# Patient Record
Sex: Female | Born: 1990 | Race: Black or African American | Hispanic: No | Marital: Single | Smoking: Former smoker
Health system: Southern US, Community
[De-identification: ages and names within clinical notes are randomized; demographics above are authoritative.]

## PROBLEM LIST (undated history)

## (undated) ENCOUNTER — Inpatient Hospital Stay (HOSPITAL_COMMUNITY): Payer: Self-pay

## (undated) DIAGNOSIS — N898 Other specified noninflammatory disorders of vagina: Secondary | ICD-10-CM

## (undated) DIAGNOSIS — A630 Anogenital (venereal) warts: Secondary | ICD-10-CM

## (undated) DIAGNOSIS — N83209 Unspecified ovarian cyst, unspecified side: Secondary | ICD-10-CM

## (undated) DIAGNOSIS — L0291 Cutaneous abscess, unspecified: Secondary | ICD-10-CM

## (undated) HISTORY — PX: THERAPEUTIC ABORTION: SHX798

---
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
## 2010-10-18 ENCOUNTER — Other Ambulatory Visit: Payer: Self-pay | Admitting: Internal Medicine

## 2010-10-18 DIAGNOSIS — R102 Pelvic and perineal pain: Secondary | ICD-10-CM

## 2010-10-22 ENCOUNTER — Ambulatory Visit
Admission: RE | Admit: 2010-10-22 | Discharge: 2010-10-22 | Disposition: A | Discharge status: Home / Self Care | Payer: No Typology Code available for payment source | Source: Ambulatory Visit | Attending: Internal Medicine | Admitting: Internal Medicine

## 2010-10-22 ENCOUNTER — Other Ambulatory Visit: Payer: Self-pay

## 2010-10-22 DIAGNOSIS — R102 Pelvic and perineal pain: Secondary | ICD-10-CM

## 2010-11-03 LAB — WET PREP, GENITAL

## 2010-11-03 LAB — GC/CHLAMYDIA PROBE AMP, GENITAL: Chlamydia, DNA Probe: NEGATIVE

## 2010-11-15 LAB — URINALYSIS, ROUTINE W REFLEX MICROSCOPIC
Glucose, UA: NEGATIVE
Hgb urine dipstick: NEGATIVE
Protein, ur: NEGATIVE
Specific Gravity, Urine: 1.02
Urobilinogen, UA: >8 — ABNORMAL HIGH

## 2010-11-15 LAB — URINE MICROSCOPIC-ADD ON

## 2010-11-15 LAB — POCT PREGNANCY, URINE: Preg Test, Ur: NEGATIVE

## 2010-11-15 LAB — WET PREP, GENITAL: Trich, Wet Prep: NONE SEEN

## 2010-11-15 LAB — GC/CHLAMYDIA PROBE AMP, GENITAL: GC Probe Amp, Genital: NEGATIVE

## 2011-02-01 DIAGNOSIS — N83209 Unspecified ovarian cyst, unspecified side: Secondary | ICD-10-CM

## 2011-02-01 HISTORY — DX: Unspecified ovarian cyst, unspecified side: N83.209

## 2011-02-17 ENCOUNTER — Encounter (HOSPITAL_COMMUNITY): Payer: Self-pay | Admitting: Emergency Medicine

## 2011-02-17 ENCOUNTER — Emergency Department (HOSPITAL_COMMUNITY)
Admission: EM | Admit: 2011-02-17 | Discharge: 2011-02-17 | Disposition: A | Discharge status: Home / Self Care | Payer: Self-pay | Attending: Emergency Medicine | Admitting: Emergency Medicine

## 2011-02-17 DIAGNOSIS — R21 Rash and other nonspecific skin eruption: Secondary | ICD-10-CM | POA: Insufficient documentation

## 2011-02-17 DIAGNOSIS — B86 Scabies: Secondary | ICD-10-CM | POA: Insufficient documentation

## 2011-02-17 DIAGNOSIS — L298 Other pruritus: Secondary | ICD-10-CM | POA: Insufficient documentation

## 2011-02-17 DIAGNOSIS — L2989 Other pruritus: Secondary | ICD-10-CM | POA: Insufficient documentation

## 2011-02-17 HISTORY — DX: Unspecified ovarian cyst, unspecified side: N83.209

## 2011-02-17 MED ORDER — PERMETHRIN 5 % EX CREA: TOPICAL_CREAM | CUTANEOUS | Status: AC

## 2011-02-17 NOTE — ED Notes (Signed)
Per Pt: pt noticed bumps on skin and thought it was related to eczema (Oct 2012), over time noticed that bumps are between fingers, toes, axilla, back and buttocks. Appear to pt to be scabies, boyfriend also appears to have it. (not being treated yet)

## 2011-02-17 NOTE — ED Provider Notes (Signed)
History     CSN: [id number]  Arrival date & time 02/17/11  1523   First MD Initiated Contact with Patient 02/17/11 1549      Chief Complaint  Patient presents with  . Rash    (Consider location/radiation/quality/duration/timing/severity/associated sxs/prior treatment) Patient is a 21 y.o. female presenting with rash. The history is provided by the patient.  Rash  This is a new problem. The current episode started more than 1 week ago. The problem has been gradually worsening. The problem is associated with an unknown factor. There has been no fever. Affected Location: hands, feet, breasts, axilla, buttocks. The patient is experiencing no pain. Associated symptoms include itching. She has tried antihistamines and anti-itch cream for the symptoms. The treatment provided mild relief.  Pt and boyfriend with same rash. No new exposures to soaps, lotions, detergents, or pets. Lives in a dorm.  Past Medical History  Diagnosis Date  . Ovarian cyst     History reviewed. No pertinent past surgical history.  No family history on file.  History  Substance Use Topics  . Smoking status: Never Smoker   . Smokeless tobacco: Not on file  . Alcohol Use: Yes     occassional, light     Review of Systems  Constitutional: Negative for fever and chills.  HENT: Negative for neck pain and neck stiffness.   Eyes: Negative for visual disturbance.  Respiratory: Negative for shortness of breath.   Cardiovascular: Negative for chest pain.  Gastrointestinal: Negative for vomiting and abdominal pain.  Musculoskeletal: Negative for back pain and gait problem.  Skin: Positive for itching and rash.  Neurological: Negative for dizziness, syncope and headaches.  Psychiatric/Behavioral: Negative for confusion.    Allergies  Review of patient's allergies indicates no known allergies.  Home Medications  No current outpatient prescriptions on file.  BP 108/70  Pulse 84  Temp(Src) 98.4 F (36.9 C)  (Oral)  Resp 18  Ht 5\' 7"  (1.702 m)  Wt 160 lb (72.576 kg)  BMI 25.06 kg/m2  SpO2 98%  LMP 01/26/2011  Physical Exam  Nursing note and vitals reviewed. Constitutional: She is oriented to person, place, and time. She appears well-developed and well-nourished. No distress.  HENT:  Head: Normocephalic and atraumatic.  Right Ear: External ear normal.  Left Ear: External ear normal.  Eyes: Pupils are equal, round, and reactive to light.  Neck: Normal range of motion. Neck supple.  Cardiovascular: Normal rate and regular rhythm.   Pulmonary/Chest: Effort normal. No respiratory distress.  Abdominal: Soft. She exhibits no distension. There is no tenderness.  Musculoskeletal: She exhibits no edema and no tenderness.  Neurological: She is alert and oriented to person, place, and time. No cranial nerve deficit. Coordination normal.       Gait normal  Skin: Skin is warm and dry. Rash noted.       Scattered papular rash to all extremities, breasts, axilla with tunnelling to various areas of hands, left breast, left foot. No pustules or erythema.    ED Course  Procedures (including critical care time)  Labs Reviewed - No data to display No results found.     MDM  Rash c/w scabies- will treat with permethrin and have advised tx of partner as well        Shaaron Adler, [place] 02/17/11 1604

## 2011-02-17 NOTE — ED Provider Notes (Signed)
Medical screening examination/treatment/procedure(s) were performed by non-physician practitioner and as supervising physician I was immediately available for consultation/collaboration.  Gevin Perea R. Menna Abeln, MD 02/17/11 1623 

## 2011-05-30 ENCOUNTER — Encounter (HOSPITAL_COMMUNITY): Payer: Self-pay | Admitting: *Deleted

## 2011-05-30 ENCOUNTER — Emergency Department (HOSPITAL_COMMUNITY)
Admission: EM | Admit: 2011-05-30 | Discharge: 2011-05-30 | Disposition: A | Discharge status: Home / Self Care | Payer: Self-pay | Attending: Emergency Medicine | Admitting: Emergency Medicine

## 2011-05-30 DIAGNOSIS — N751 Abscess of Bartholin's gland: Secondary | ICD-10-CM | POA: Insufficient documentation

## 2011-05-30 MED ORDER — PROMETHAZINE HCL 25 MG/ML IJ SOLN
12.5000 mg | Freq: Once | INTRAMUSCULAR | Status: AC
  Administered 2011-05-30: 12.5 mg via INTRAMUSCULAR
  Filled 2011-05-30: qty 1

## 2011-05-30 MED ORDER — HYDROMORPHONE HCL PF 1 MG/ML IJ SOLN
1.0000 mg | Freq: Once | INTRAMUSCULAR | Status: AC
  Administered 2011-05-30: 1 mg via INTRAMUSCULAR
  Filled 2011-05-30: qty 1

## 2011-05-30 MED ORDER — OXYCODONE-ACETAMINOPHEN 5-325 MG PO TABS: 2.0000 | ORAL_TABLET | ORAL | Status: AC | PRN

## 2011-05-30 NOTE — ED Notes (Signed)
Rx given x1 D/c instructions reviewed w/ pt - pt denies any further questions or concerns at present.   

## 2011-05-30 NOTE — ED Provider Notes (Addendum)
History     CSN: [id number]  Arrival date & time 05/30/11  1520   First MD Initiated Contact with Patient 05/30/11 1717      Chief Complaint  Patient presents with  . Abscess    (Consider location/radiation/quality/duration/timing/severity/associated sxs/prior treatment) Patient is a 21 y.o. female presenting with abscess. The history is provided by the patient.  Abscess  This is a new problem. The current episode started less than one week ago. The onset was gradual. The problem occurs continuously. The problem has been gradually worsening. Affected Location: left labia. The problem is severe. The abscess is characterized by painfulness and burning. The abscess first occurred at home. Pertinent negatives include no fever, no diarrhea, no vomiting, no congestion, no rhinorrhea and no cough.    Past Medical History  Diagnosis Date  . Ovarian cyst     History reviewed. No pertinent past surgical history.  History reviewed. No pertinent family history.  History  Substance Use Topics  . Smoking status: Never Smoker   . Smokeless tobacco: Not on file  . Alcohol Use: Yes     occassional, light    OB History    Grav Para Term Preterm Abortions TAB SAB Ect Mult Living                  Review of Systems  Constitutional: Negative for fever, chills, diaphoresis and fatigue.  HENT: Negative for congestion, rhinorrhea and sneezing.   Eyes: Negative.   Respiratory: Negative for cough, chest tightness and shortness of breath.   Cardiovascular: Negative for chest pain and leg swelling.  Gastrointestinal: Negative for nausea, vomiting, abdominal pain, diarrhea and blood in stool.  Genitourinary: Negative for frequency, hematuria, flank pain and difficulty urinating.  Musculoskeletal: Negative for back pain and arthralgias.  Skin: Negative for rash.  Neurological: Negative for dizziness, speech difficulty, weakness, numbness and headaches.    Allergies  Review of patient's  allergies indicates no known allergies.  Home Medications   Current Outpatient Rx  Name Route Sig Dispense Refill  . ASPIRIN 325 MG PO TABS Oral Take 325 mg by mouth daily.    Marland Kitchen PRESCRIPTION MEDICATION Oral Take 1 tablet by mouth once. 1 time dose antibiotic tablet post-abortion.    . OXYCODONE-ACETAMINOPHEN 5-325 MG PO TABS Oral Take 2 tablets by mouth every 4 (four) hours as needed for pain. 15 tablet 0    BP 107/73  Pulse 83  Temp(Src) 98.9 F (37.2 C) (Oral)  Resp 16  SpO2 100%  Physical Exam  Constitutional: She is oriented to person, place, and time. She appears well-developed and well-nourished.  HENT:  Head: Normocephalic and atraumatic.  Eyes: Pupils are equal, round, and reactive to light.  Neck: Normal range of motion. Neck supple.  Cardiovascular: Normal rate, regular rhythm and normal heart sounds.   Pulmonary/Chest: Effort normal and breath sounds normal. No respiratory distress. She has no wheezes. She has no rales. She exhibits no tenderness.  Abdominal: Soft. Bowel sounds are normal. There is no tenderness. There is no rebound and no guarding.  Genitourinary:       2cm fluctuant abscess left labia  Musculoskeletal: Normal range of motion. She exhibits no edema.  Lymphadenopathy:    She has no cervical adenopathy.  Neurological: She is alert and oriented to person, place, and time.  Skin: Skin is warm and dry. No rash noted.  Psychiatric: She has a normal mood and affect.    ED Course  INCISION AND DRAINAGE Date/Time: 05/30/2011  8:57 PM Performed by: Rolan Bucco Authorized by: Rolan Bucco Consent: Verbal consent obtained. Risks and benefits: risks, benefits and alternatives were discussed Consent given by: patient Patient identity confirmed: verbally with patient Type: abscess Body area: anogenital Location details: vulva Anesthesia: local infiltration Local anesthetic: lidocaine 2% without epinephrine Anesthetic total: 2 ml Patient sedated:  no Scalpel size: 11 Incision type: single straight Complexity: simple Drainage: bloody and purulent Drainage amount: copious Wound treatment: wound left open Packing material: 1/4 in iodoform gauze Patient tolerance: Patient tolerated the procedure well with no immediate complications.   (including critical care time)   Labs Reviewed  WOUND CULTURE   No results found.   1. Bartholin's gland abscess       MDM  Pt with likely bartholins gland abscess.  No surrounding cellulitis.  No hx of vag d/c.  Advised warm soaks.  Return in 2 days for wound check  Or sooner if symtpoms worsen        Rolan Bucco, MD 05/30/11 4098  Rolan Bucco, MD 05/30/11 2059

## 2011-05-30 NOTE — ED Notes (Signed)
Pt states she has L labia ulcer for the past 2 days.  Pt states that she has some body aches as well.  No hx of abscess.  Pt states that she had abscess last week, but it became worse after she had surgical abortion.  Pt states that lip is swollen, and hard abscess is about 1/2".

## 2011-05-30 NOTE — ED Notes (Signed)
Pt in c/o cyst to left labia x3-4 days, increased pain and swelling today

## 2011-05-30 NOTE — Discharge Instructions (Signed)
Bartholin's Cyst and Abscess  Bartholin's glands produce mucus through small openings just outside the opening of the vagina. The mucus helps with lubrication around the vagina during sexual intercourse. If the duct becomes clogged, the gland will swell and cause a bulge on the inside of the vagina. If this becomes big enough, it can be seen and felt on the outside of the vagina as well. Sometimes, the swelling will shrink away by itself. However, if the cyst becomes infected, the Bartholin's cyst fills with pus and becomes more swollen, red and painful and becomes a Bartholin's abscess. This usually requires antibiotic treatment and surgical drainage. Sometimes, with minor surgery under local anesthesia, a small tube is placed in the cyst or abscess wall. This allows continued drainage for up to 6 weeks. Minor surgery can make a new opening to replace the clogged duct and help prevent future cysts or abscess.  If the abscess occurs several times, a minor operation with local anesthesia is necessary to remove the Bartholin's gland completely or to make it drain better. Cutting open the gland and suturing the edges to make the opening of the gland bigger (marsupialization) may be needed and should usually be done by your obstetrician-gyncology physician. Antibiotics are usually prescribed for this condition. Take all antibiotics as prescribed. Make sure to finish them even if you are doing better. Take warm sitz baths for 20 minutes, 3 times a day. See your caregiver for follow-up care as recommended.  SEEK MEDICAL CARE IF:    You have increasing pain, swelling, or redness near the vagina.   You have vomiting or inability to tolerate medicines.   You have a fever.   You have uncontrolled bleeding from the vagina.  Document Released: 02/25/2004 Document Revised: 01/06/2011 Document Reviewed: 02/27/2009  [employer] Patient Information 2012 [employer].

## 2011-06-01 ENCOUNTER — Encounter (HOSPITAL_COMMUNITY): Payer: Self-pay | Admitting: Emergency Medicine

## 2011-06-01 ENCOUNTER — Emergency Department (HOSPITAL_COMMUNITY)
Admission: EM | Admit: 2011-06-01 | Discharge: 2011-06-02 | Disposition: A | Discharge status: Home / Self Care | Payer: Self-pay | Attending: Emergency Medicine | Admitting: Emergency Medicine

## 2011-06-01 DIAGNOSIS — N750 Cyst of Bartholin's gland: Secondary | ICD-10-CM | POA: Insufficient documentation

## 2011-06-01 DIAGNOSIS — Z7982 Long term (current) use of aspirin: Secondary | ICD-10-CM | POA: Insufficient documentation

## 2011-06-01 NOTE — ED Notes (Signed)
Pt alert, nad, c/o wound check, pt seen in ED and returns for drain removal, drain located in vagina, resp even unlabored, skin pwd

## 2011-06-02 LAB — WOUND CULTURE: Culture: NO GROWTH

## 2011-06-02 NOTE — Discharge Instructions (Signed)
It looks like your abscess is healing well. Continue to keep it clean. Warm compresses or warm soapy baths several times a day. Continue your pain medications. Take motrin 600mg  every 6 hrs for pain.  Follow up with GYN as needed.   Bartholin's Cyst or Abscess Bartholin's glands are small glands located within the folds of skin (labia) along the sides of the lower opening of the vagina (birth canal). A cyst may develop when the duct of the gland becomes blocked. When this happens, fluid that accumulates within the cyst can become infected. This is known as an abscess. The Bartholin gland produces a mucous fluid to lubricate the outside of the vagina during sexual intercourse. SYMPTOMS   Patients with a small cyst may not have any symptoms.   Mild discomfort to severe pain depending on the size of the cyst and if it is infected (abscess).   Pain, redness, and swelling around the lower opening of the vagina.   Painful intercourse.   Pressure in the perineal area.   Swelling of the lips of the vagina (labia).   The cyst or abscess can be on one side or both sides of the vagina.  DIAGNOSIS   A large swelling is seen in the lower vagina area by your caregiver.   Painful to touch.   Redness and pain, if it is an abscess.  TREATMENT   Sometimes the cyst will go away on its own.   Apply warm wet compresses to the area or take hot sitz baths several times a day.   An incision to drain the cyst or abscess with local anesthesia.   Culture the pus, if it is an abscess.   Antibiotic treatment, if it is an abscess.   Cut open the gland and suture the edges to make the opening of the gland bigger (marsupialization).   Remove the whole gland if the cyst or abscess returns.  PREVENTION   Practice good hygiene.   Clean the vaginal area with a mild soap and soft cloth when bathing.   Do not rub hard in the vaginal area when bathing.   Protect the crotch area with a padded cushion if you  take long bike rides or ride horses.   Be sure you are well lubricated when you have sexual intercourse.  HOME CARE INSTRUCTIONS   If your cyst or abscess was opened, a small piece of gauze, or a drain, may have been placed in the wound to allow drainage. Do not remove this gauze or drain unless directed by your caregiver.   Wear feminine pads, not tampons, as needed for any drainage or bleeding.   If antibiotics were prescribed, take them exactly as directed. Finish the entire course.   Only take over-the-counter or prescription medicines for pain, discomfort, or fever as directed by your caregiver.  SEEK IMMEDIATE MEDICAL CARE IF:   You have an increase in pain, redness, swelling, or drainage.   You have bleeding from the wound which results in the use of more than the number of pads suggested by your caregiver in 24 hours.   You have chills.   You have a fever.   You develop any new problems (symptoms) or aggravation of your existing condition.  MAKE SURE YOU:   Understand these instructions.   Will watch your condition.   Will get help right away if you are not doing well or get worse.  Document Released: 01/17/2005 Document Revised: 01/06/2011 Document Reviewed: 09/05/2007 Exit[employer] Patient Information  [address] [place].

## 2011-06-02 NOTE — ED Provider Notes (Signed)
History     CSN: [id number]  Arrival date & time 06/01/11  2325   First MD Initiated Contact with Patient 06/02/11 0149      Chief Complaint  Patient presents with  . Wound Check    (Consider location/radiation/quality/duration/timing/severity/associated sxs/prior treatment) Patient is a 21 y.o. female presenting with wound check. The history is provided by the patient.  Wound Check  She was treated in the ED 2 to 3 days ago. Previous treatment in the ED includes I&D of abscess. There has been no treatment since the wound repair.  Pt states she had a bartholin's gland abscess drained and packed. Here for recheck and removal. States still having pain, but it decreased in size. Denies fever, chills, malaise. No other complaints.   Past Medical History  Diagnosis Date  . Ovarian cyst     History reviewed. No pertinent past surgical history.  No family history on file.  History  Substance Use Topics  . Smoking status: Never Smoker   . Smokeless tobacco: Not on file  . Alcohol Use: Yes     occassional, light    OB History    Grav Para Term Preterm Abortions TAB SAB Ect Mult Living                  Review of Systems  Constitutional: Negative for fever and chills.  Respiratory: Negative.   Cardiovascular: Negative.   Genitourinary: Negative.   Musculoskeletal: Negative.   Skin: Positive for wound.  Neurological: Negative.     Allergies  Review of patient's allergies indicates no known allergies.  Home Medications   Current Outpatient Rx  Name Route Sig Dispense Refill  . ASPIRIN 325 MG PO TABS Oral Take 325 mg by mouth daily.    . OXYCODONE-ACETAMINOPHEN 5-325 MG PO TABS Oral Take 2 tablets by mouth every 4 (four) hours as needed for pain. 15 tablet 0    BP 112/72  Pulse 98  Temp 98 F (36.7 C)  Resp 16  SpO2 98%  Physical Exam  Nursing note and vitals reviewed. Constitutional: She is oriented to person, place, and time. She appears well-developed and  well-nourished. No distress.  HENT:  Head: Normocephalic.  Pulmonary/Chest: Effort normal and breath sounds normal. No respiratory distress.  Abdominal: Soft. Bowel sounds are normal. She exhibits no distension. There is no tenderness.  Genitourinary:       Pt on her period. Left labia appears normal in size, tender, small incision from recent I&D with packing in place. No drainage  Neurological: She is alert and oriented to person, place, and time.  Skin: Skin is warm and dry.  Psychiatric: She has a normal mood and affect.    ED Course  Procedures (including critical care time)  Bartholin's gland I&D 2 days ago. NO swelling. No complications. Packing removed. No drainage. Will d/c home with follow up as needed. She is non toxic, nad.   1. Bartholin gland cyst       MDM          Lottie Mussel, PA 06/02/11 0501

## 2011-06-02 NOTE — ED Provider Notes (Signed)
Medical screening examination/treatment/procedure(s) were performed by non-physician practitioner and as supervising physician I was immediately available for consultation/collaboration.  Olivia Mackie, MD 06/02/11 [phone]

## 2012-03-02 ENCOUNTER — Emergency Department (HOSPITAL_COMMUNITY): Payer: Self-pay

## 2012-03-02 ENCOUNTER — Encounter (HOSPITAL_COMMUNITY): Payer: Self-pay | Admitting: Emergency Medicine

## 2012-03-02 ENCOUNTER — Emergency Department (HOSPITAL_COMMUNITY)
Admission: EM | Admit: 2012-03-02 | Discharge: 2012-03-03 | Disposition: A | Discharge status: Home / Self Care | Payer: Self-pay | Attending: Emergency Medicine | Admitting: Emergency Medicine

## 2012-03-02 DIAGNOSIS — Z87891 Personal history of nicotine dependence: Secondary | ICD-10-CM | POA: Insufficient documentation

## 2012-03-02 DIAGNOSIS — M549 Dorsalgia, unspecified: Secondary | ICD-10-CM | POA: Insufficient documentation

## 2012-03-02 DIAGNOSIS — N949 Unspecified condition associated with female genital organs and menstrual cycle: Secondary | ICD-10-CM | POA: Insufficient documentation

## 2012-03-02 DIAGNOSIS — Z8742 Personal history of other diseases of the female genital tract: Secondary | ICD-10-CM | POA: Insufficient documentation

## 2012-03-02 DIAGNOSIS — R1031 Right lower quadrant pain: Secondary | ICD-10-CM | POA: Insufficient documentation

## 2012-03-02 DIAGNOSIS — R112 Nausea with vomiting, unspecified: Secondary | ICD-10-CM | POA: Insufficient documentation

## 2012-03-02 DIAGNOSIS — Z3490 Encounter for supervision of normal pregnancy, unspecified, unspecified trimester: Secondary | ICD-10-CM

## 2012-03-02 DIAGNOSIS — K921 Melena: Secondary | ICD-10-CM | POA: Insufficient documentation

## 2012-03-02 DIAGNOSIS — Z872 Personal history of diseases of the skin and subcutaneous tissue: Secondary | ICD-10-CM | POA: Insufficient documentation

## 2012-03-02 DIAGNOSIS — Z3201 Encounter for pregnancy test, result positive: Secondary | ICD-10-CM | POA: Insufficient documentation

## 2012-03-02 HISTORY — DX: Cutaneous abscess, unspecified: L02.91

## 2012-03-02 LAB — COMPREHENSIVE METABOLIC PANEL
Albumin: 3.7 g/dL (ref 3.5–5.2)
Alkaline Phosphatase: 47 U/L (ref 39–117)
BUN: 10 mg/dL (ref 6–23)
CO2: 23 mEq/L (ref 19–32)
Chloride: 100 mEq/L (ref 96–112)
Glucose, Bld: 101 mg/dL — ABNORMAL HIGH (ref 70–99)
Potassium: 4 mEq/L (ref 3.5–5.1)
Sodium: 134 mEq/L — ABNORMAL LOW (ref 135–145)
Total Protein: 8.1 g/dL (ref 6.0–8.3)

## 2012-03-02 LAB — CBC WITH DIFFERENTIAL/PLATELET
Basophils Absolute: 0 10*3/uL (ref 0.0–0.1)
Basophils Relative: 0 % (ref 0–1)
HCT: 32.4 % — ABNORMAL LOW (ref 36.0–46.0)
Lymphocytes Relative: 13 % (ref 12–46)
MCV: 77.1 fL — ABNORMAL LOW (ref 78.0–100.0)
Neutro Abs: 8.9 10*3/uL — ABNORMAL HIGH (ref 1.7–7.7)
Neutrophils Relative %: 80 % — ABNORMAL HIGH (ref 43–77)
Platelets: 323 10*3/uL (ref 150–400)
RBC: 4.2 MIL/uL (ref 3.87–5.11)
RDW: 15.9 % — ABNORMAL HIGH (ref 11.5–15.5)

## 2012-03-02 LAB — OCCULT BLOOD, POC DEVICE: Fecal Occult Bld: NEGATIVE

## 2012-03-02 MED ORDER — ONDANSETRON HCL 4 MG/2ML IJ SOLN
4.0000 mg | Freq: Once | INTRAMUSCULAR | Status: AC
  Administered 2012-03-02: 4 mg via INTRAVENOUS
  Filled 2012-03-02: qty 2

## 2012-03-02 MED ORDER — SODIUM CHLORIDE 0.9 % IV SOLN
Freq: Once | INTRAVENOUS | Status: AC
  Administered 2012-03-02: via INTRAVENOUS

## 2012-03-02 NOTE — ED Provider Notes (Signed)
History     CSN: [id number]  Arrival date & time 03/02/12  2148   First MD Initiated Contact with Patient 03/02/12 2251      Chief Complaint  Patient presents with  . Abdominal Pain  . Melena    (Consider location/radiation/quality/duration/timing/severity/associated sxs/prior treatment) HPI Comments: Patient states she has had 3-4 months of diffuse abdominal pain that moves locations daily.  Also reports black stools, nausea and vomiting today. She usually takes Motrin for the discomfort but this is not helping for the past several days, report a creamy "normal' vaginal discharge, denies dysuria   Patient is a 22 y.o. female presenting with abdominal pain. The history is provided by the patient.  Abdominal Pain The primary symptoms of the illness include abdominal pain, nausea and vomiting. The primary symptoms of the illness do not include fever, shortness of breath, diarrhea, dysuria or vaginal bleeding. The current episode started more than 2 days ago. The onset of the illness was gradual. The problem has been gradually worsening.  The abdominal pain began more than 2 days ago. The pain came on gradually. The abdominal pain has been gradually worsening since its onset. The abdominal pain is generalized. The severity of the abdominal pain is 4/10. The abdominal pain is relieved by nothing.  Nausea began yesterday.  The vomiting began today. The emesis contains bilious material.  The patient states that she believes she is currently not pregnant. The patient has not had a change in bowel habit. Additional symptoms associated with the illness include back pain. Symptoms associated with the illness do not include chills, constipation, hematuria or frequency.    Past Medical History  Diagnosis Date  . Ovarian cyst   . Abscess     Past Surgical History  Procedure Date  . Therapeutic abortion     No family history on file.  History  Substance Use Topics  . Smoking status:  Former Smoker    Types: Pipe    Quit date: 01/31/2010  . Smokeless tobacco: Never Used  . Alcohol Use: Yes     Comment: occassional, light    OB History    Grav Para Term Preterm Abortions TAB SAB Ect Mult Living                  Review of Systems  Constitutional: Negative for fever and chills.  HENT: Negative.   Eyes: Negative.   Respiratory: Negative for shortness of breath.   Gastrointestinal: Positive for nausea, vomiting and abdominal pain. Negative for diarrhea and constipation.  Genitourinary: Positive for vaginal pain and pelvic pain. Negative for dysuria, frequency, hematuria, vaginal bleeding and menstrual problem.  Musculoskeletal: Positive for back pain.  Skin: Negative for rash and wound.  Neurological: Negative for dizziness and weakness.    Allergies  Review of patient's allergies indicates no known allergies.  Home Medications   Current Outpatient Rx  Name  Route  Sig  Dispense  Refill  . PRENATAL COMPLETE 14-0.4 MG PO TABS   Oral   Take 1 tablet by mouth 1 day or 1 dose.   60 each   0     BP 97/58  Pulse 81  Temp 98 F (36.7 C) (Oral)  Resp 20  SpO2 100%  LMP 01/26/2012  Physical Exam  Constitutional: She is oriented to person, place, and time. She appears well-developed and well-nourished.  HENT:  Head: Normocephalic.  Eyes: Pupils are equal, round, and reactive to light.  Neck: Normal range of motion.  Cardiovascular: Normal rate and regular rhythm.   Pulmonary/Chest: Effort normal and breath sounds normal. She has no wheezes. She exhibits no tenderness.  Abdominal: She exhibits no distension. There is no hepatomegaly. There is tenderness in the right lower quadrant and epigastric area.  Genitourinary: Rectal exam shows no external hemorrhoid, no internal hemorrhoid and no tenderness. Guaiac negative stool. Cervix exhibits discharge. Cervix exhibits no motion tenderness. Right adnexum displays tenderness. Vaginal discharge found.   Musculoskeletal: Normal range of motion.  Neurological: She is alert and oriented to person, place, and time.  Skin: Skin is warm and dry.    ED Course  Procedures (including critical care time)  Labs Reviewed  URINALYSIS, MICROSCOPIC ONLY - Abnormal; Notable for the following:    APPearance TURBID (*)     All other components within normal limits  CBC WITH DIFFERENTIAL - Abnormal; Notable for the following:    WBC 11.2 (*)     Hemoglobin 10.7 (*)     HCT 32.4 (*)     MCV 77.1 (*)     MCH 25.5 (*)     RDW 15.9 (*)     Neutrophils Relative 80 (*)     Neutro Abs 8.9 (*)     All other components within normal limits  COMPREHENSIVE METABOLIC PANEL - Abnormal; Notable for the following:    Sodium 134 (*)     Glucose, Bld 101 (*)     Total Bilirubin 0.1 (*)     All other components within normal limits  WET PREP, GENITAL - Abnormal; Notable for the following:    WBC, Wet Prep HPF POC RARE (*)     All other components within normal limits  HCG, QUANTITATIVE, PREGNANCY - Abnormal; Notable for the following:    hCG, Beta Chain, Quant, S 6351 (*)     All other components within normal limits  RH IG WORKUP (INCLUDES ABO/RH)  OCCULT BLOOD, POC DEVICE  GC/CHLAMYDIA PROBE AMP   Mr Pelvis Wo Contrast  03/03/2012  *RADIOLOGY REPORT*  Clinical Data: Right lower quadrant pain in a patient with 5-week intrauterine pregnancy.  MRI ABDOMEN WITHOUT CONTRAST,MRI PELVIS WITHOUT CONTRAST  Technique:  Multiplanar multisequence MR imaging of the abdomen was performed.  Multi-planar multi-sequence MR imaging of the pelvis was performed following the standard protocol. No intravenous contrast was administered.  Comparison: None.  Findings: The appendix is partially segmentally identified and appears normal.  There is no evidence of abnormal fluid or infiltration in the right lower quadrant.  No abscess.  Stool filled colon without distension.  No small bowel distension. Visualized portions of the liver and  spleen, pancreas, gallbladder, kidneys, and abdominal aorta are unremarkable.  No free fluid in the abdomen.  Pelvis:  The uterus is anteverted.  An intrauterine fluid collection is identified consistent with the known intrauterine gestational sac.  There appears to be small cysts on the ovaries. The bladder wall is not thickened.  No free or loculated pelvic fluid collections.  Visualized bone marrow signal intensities are homogeneous and normal.  IMPRESSION: Segmental identification of a normal appendix without any inflammatory changes seen in the right lower quadrant.  Nothing to suggest appendicitis.  Intrauterine gestational sac is identified.   Original Report Authenticated By: Burman Nieves, M.D.    Mr Abdomen Wo Contrast  03/03/2012  *RADIOLOGY REPORT*  Clinical Data: Right lower quadrant pain in a patient with 5-week intrauterine pregnancy.  MRI ABDOMEN WITHOUT CONTRAST,MRI PELVIS WITHOUT CONTRAST  Technique:  Multiplanar multisequence MR imaging  of the abdomen was performed.  Multi-planar multi-sequence MR imaging of the pelvis was performed following the standard protocol. No intravenous contrast was administered.  Comparison: None.  Findings: The appendix is partially segmentally identified and appears normal.  There is no evidence of abnormal fluid or infiltration in the right lower quadrant.  No abscess.  Stool filled colon without distension.  No small bowel distension. Visualized portions of the liver and spleen, pancreas, gallbladder, kidneys, and abdominal aorta are unremarkable.  No free fluid in the abdomen.  Pelvis:  The uterus is anteverted.  An intrauterine fluid collection is identified consistent with the known intrauterine gestational sac.  There appears to be small cysts on the ovaries. The bladder wall is not thickened.  No free or loculated pelvic fluid collections.  Visualized bone marrow signal intensities are homogeneous and normal.  IMPRESSION: Segmental identification of a  normal appendix without any inflammatory changes seen in the right lower quadrant.  Nothing to suggest appendicitis.  Intrauterine gestational sac is identified.   Original Report Authenticated By: Burman Nieves, M.D.    US Ob Comp Less 14 Wks  03/03/2012  *RADIOLOGY REPORT*  Clinical Data: Right lower quadrant pain.  5-week-2-day gestational age by LMP.  OBSTETRIC <14 WK [place] AND TRANSVAGINAL OB US  Technique:  Both transabdominal and transvaginal ultrasound examinations were performed for complete evaluation of the gestation as well as the maternal uterus, adnexal regions, and pelvic cul-de-sac.  Transvaginal technique was performed to assess early pregnancy.  Comparison:  None.  Intrauterine gestational sac:  Visualized/normal in shape. Yolk sac: Not visualized Embryo: Not visualized  MSD: 8 mm  5 w 4 d  Maternal uterus/adnexae: No fibroids identified.  Small left ovarian corpus luteum noted, as well as a simple left para ovarian cyst measuring 1.9 cm.  Right ovary is normal in appearance.  No adnexal masses or free fluid identified.  IMPRESSION:  1.  Single intrauterine gestational sac measuring 5 weeks 4 days, which is concordant with LMP. 2.  No significant maternal uterine or adnexal abnormality identified.   Original Report Authenticated By: Myles Rosenthal, M.D.    US Ob Transvaginal  03/03/2012  *RADIOLOGY REPORT*  Clinical Data: Right lower quadrant pain.  5-week-2-day gestational age by LMP.  OBSTETRIC <14 WK [place] AND TRANSVAGINAL OB US  Technique:  Both transabdominal and transvaginal ultrasound examinations were performed for complete evaluation of the gestation as well as the maternal uterus, adnexal regions, and pelvic cul-de-sac.  Transvaginal technique was performed to assess early pregnancy.  Comparison:  None.  Intrauterine gestational sac:  Visualized/normal in shape. Yolk sac: Not visualized Embryo: Not visualized  MSD: 8 mm  5 w 4 d  Maternal uterus/adnexae: No fibroids identified.  Small left  ovarian corpus luteum noted, as well as a simple left para ovarian cyst measuring 1.9 cm.  Right ovary is normal in appearance.  No adnexal masses or free fluid identified.  IMPRESSION:  1.  Single intrauterine gestational sac measuring 5 weeks 4 days, which is concordant with LMP. 2.  No significant maternal uterine or adnexal abnormality identified.   Original Report Authenticated By: Myles Rosenthal, M.D.      1. Pregnancy   2. Abdominal pain, right lower quadrant       MDM  Due to RLQ pain and normal intrauterine pregnancy will get MRI to R/O appendicitis MRI normal       Arman Filter, NP 03/03/12 [phone]

## 2012-03-02 NOTE — ED Notes (Signed)
Pt c/o of 9/10 lower abdominal/pelvic pain that began after an ovarian cyst that occurred several months ago. Pt states that the pain moves around in her stomach. Pt reports nausea with soft soft stools that are black. Bowel sounds positive in four quadrant, pt reports tenderness upon palpation. Pt denies vaginal bleeding, however states she has had a creamy white discharge that does not have an odor.

## 2012-03-03 ENCOUNTER — Ambulatory Visit (HOSPITAL_COMMUNITY)
Admit: 2012-03-03 | Discharge: 2012-03-03 | Disposition: A | Discharge status: Home / Self Care | Payer: Self-pay | Attending: Internal Medicine | Admitting: Internal Medicine

## 2012-03-03 ENCOUNTER — Emergency Department (HOSPITAL_COMMUNITY): Payer: Self-pay

## 2012-03-03 LAB — URINALYSIS, MICROSCOPIC ONLY
Bilirubin Urine: NEGATIVE
Hgb urine dipstick: NEGATIVE
Leukocytes, UA: NEGATIVE
Nitrite: NEGATIVE
Protein, ur: NEGATIVE mg/dL
Specific Gravity, Urine: 1.025 (ref 1.005–1.030)
Urobilinogen, UA: 1 mg/dL (ref 0.0–1.0)

## 2012-03-03 LAB — WET PREP, GENITAL: Clue Cells Wet Prep HPF POC: NONE SEEN

## 2012-03-03 LAB — RH IG WORKUP (INCLUDES ABO/RH)
ABO/RH(D): A POS
Gestational Age(Wks): 5

## 2012-03-03 MED ORDER — PRENATAL COMPLETE 14-0.4 MG PO TABS: 1.0000 | ORAL_TABLET | ORAL | Status: DC

## 2012-03-03 MED ORDER — HYDROMORPHONE HCL PF 1 MG/ML IJ SOLN
1.0000 mg | Freq: Once | INTRAMUSCULAR | Status: AC
  Administered 2012-03-03: 1 mg via INTRAVENOUS
  Filled 2012-03-03: qty 1

## 2012-03-03 NOTE — ED Notes (Signed)
Pt has transferred to [facility] for MRI

## 2012-03-03 NOTE — ED Notes (Signed)
[facility] RN notified of pt transport to MRI

## 2012-03-03 NOTE — ED Notes (Signed)
Per Lab pt is RH+. No Rhogam needed.

## 2012-03-03 NOTE — ED Notes (Signed)
Pt continues to be at [facility] hospital for MRI

## 2012-03-03 NOTE — ED Notes (Signed)
Pt has returned just now via [facility] from [facility],  Test are completed

## 2012-03-04 LAB — GC/CHLAMYDIA PROBE AMP: CT Probe RNA: NEGATIVE

## 2012-03-04 NOTE — ED Provider Notes (Signed)
Medical screening examination/treatment/procedure(s) were performed by non-physician practitioner and as supervising physician I was immediately available for consultation/collaboration.   Mayah Urquidi M Tunis Gentle, DO 03/04/12 1933 

## 2012-07-30 ENCOUNTER — Emergency Department (HOSPITAL_COMMUNITY)
Admission: EM | Admit: 2012-07-30 | Discharge: 2012-07-30 | Disposition: A | Discharge status: Home / Self Care | Payer: Self-pay | Attending: Emergency Medicine | Admitting: Emergency Medicine

## 2012-07-30 ENCOUNTER — Encounter (HOSPITAL_COMMUNITY): Payer: Self-pay | Admitting: *Deleted

## 2012-07-30 DIAGNOSIS — N760 Acute vaginitis: Secondary | ICD-10-CM | POA: Insufficient documentation

## 2012-07-30 DIAGNOSIS — L0291 Cutaneous abscess, unspecified: Secondary | ICD-10-CM

## 2012-07-30 DIAGNOSIS — Z8742 Personal history of other diseases of the female genital tract: Secondary | ICD-10-CM | POA: Insufficient documentation

## 2012-07-30 DIAGNOSIS — Z87891 Personal history of nicotine dependence: Secondary | ICD-10-CM | POA: Insufficient documentation

## 2012-07-30 MED ORDER — OXYCODONE-ACETAMINOPHEN 5-325 MG PO TABS
2.0000 | ORAL_TABLET | Freq: Once | ORAL | Status: AC
  Administered 2012-07-30: 2 via ORAL
  Filled 2012-07-30: qty 2

## 2012-07-30 MED ORDER — SULFAMETHOXAZOLE-TRIMETHOPRIM 800-160 MG PO TABS: 1.0000 | ORAL_TABLET | Freq: Two times a day (BID) | ORAL | Status: DC

## 2012-07-30 MED ORDER — HYDROCODONE-ACETAMINOPHEN 5-325 MG PO TABS: 2.0000 | ORAL_TABLET | Freq: Four times a day (QID) | ORAL | Status: DC | PRN

## 2012-07-30 NOTE — ED Provider Notes (Signed)
History  This chart was scribed for non-physician practitioner working with Raeford Razor, MD by Greggory Stallion, ED scribe. This patient was seen in room WTR8/WTR8 and the patient's care was started at 6:59 PM.  CSN: [id number] Arrival date & time 07/30/12  1834   Chief Complaint  Patient presents with  . Abscess   The history is provided by the patient. No language interpreter was used.    HPI Comments: Kelli Jimenez is a 22 y.o. female who presents to the Emergency Department with chief complain of abscess to left vaginal area that started yesterday. She rates her pain 8/10. Pt states she has a h/o the same. She states she has taken 400 mg of ibuprofen with no relief. Pt denies vaginal discharge as associated symptoms.   Past Medical History  Diagnosis Date  . Ovarian cyst   . Abscess    Past Surgical History  Procedure Laterality Date  . Therapeutic abortion     History reviewed. No pertinent family history. History  Substance Use Topics  . Smoking status: Former Smoker    Types: Pipe    Quit date: 01/31/2010  . Smokeless tobacco: Never Used  . Alcohol Use: No     Comment: occassional, light   OB History   Grav Para Term Preterm Abortions TAB SAB Ect Mult Living                 Review of Systems  A complete 10 system review of systems was obtained and all systems are negative except as noted in the HPI and PMH.   Allergies  Review of patient's allergies indicates no known allergies.  Home Medications   Current Outpatient Rx  Name  Route  Sig  Dispense  Refill  . ibuprofen (ADVIL,MOTRIN) 200 MG tablet   Oral   Take 400 mg by mouth every 6 (six) hours as needed for pain (pain).          BP 117/77  Pulse 105  Temp(Src) 100.7 F (38.2 C) (Oral)  Resp 20  Ht 5\' 6"  (1.676 m)  Wt 140 lb (63.504 kg)  BMI 22.61 kg/m2  SpO2 100%  LMP 07/04/2012  Physical Exam  Nursing note and vitals reviewed. Constitutional: She is oriented to person, place, and time.  She appears well-developed and well-nourished. No distress.  HENT:  Head: Normocephalic and atraumatic.  Eyes: EOM are normal.  Neck: Neck supple. No tracheal deviation present.  Cardiovascular: Normal rate.   Pulmonary/Chest: Effort normal. No respiratory distress.  Genitourinary:  1 cm x 1 cm fluctuant abscess to left labia majora, tender to palpation. No active drainage or discharge.   Musculoskeletal: Normal range of motion.  Neurological: She is alert and oriented to person, place, and time.  Skin: Skin is warm and dry.  Psychiatric: She has a normal mood and affect. Her behavior is normal.    ED Course  Procedures (including critical care time)  DIAGNOSTIC STUDIES: Oxygen Saturation is 100% on RA, normal by my interpretation.    COORDINATION OF CARE: 7:15 PM-Discussed treatment plan which includes incision and drainage of abscess and antibiotic with pt at bedside and pt agreed to plan. Advised pt to follow up with OB-GYN in 2 days. Advised pt if temperature gets above 101 to go straight to [facility].   Examination chaperoned by Greggory Stallion.  INCISION AND DRAINAGE Performed by: Roxy Horseman, PA-C Consent: Verbal consent obtained. Risks and benefits: risks, benefits and alternatives were discussed Type: abscess  Body area:  left labia majora   Anesthesia: local infiltration  Incision was made with a scalpel.  Local anesthetic: lidocaine 1% without epinephrine  Anesthetic total: 1 ml  Complexity: complex Blunt dissection to break up loculations  Drainage: purulent  Drainage amount: mild  Packing material: not packed  Patient tolerance: Patient tolerated the procedure well with no immediate complications.     Labs Reviewed - No data to display No results found. 1. Abscess     MDM  Patient with abscess to the left labia majora. Abscess was drained in the ED. Will discharge with antibiotics and pain medicine. Will have patient [facility] Strict return precautions have been given. Patient is running a low-grade fever, and is mildly tachycardic, however, she is well-appearing, not hypotensive.  Filed Vitals:   07/30/12 1946  BP:   Pulse:   Temp: 99.5 F (37.5 C)  Resp:           I personally performed the services described in this documentation, which was scribed in my presence. The recorded information has been reviewed and is accurate.    Roxy Horseman, PA-C 07/30/12 1951

## 2012-07-30 NOTE — ED Notes (Signed)
Pt reports abscess to vaginal area since yesterday. Reports hx of same. Denies drainage at present.

## 2012-07-31 NOTE — ED Provider Notes (Signed)
Medical screening examination/treatment/procedure(s) were performed by non-physician practitioner and as supervising physician I was immediately available for consultation/collaboration.  Karryn Kosinski, MD 07/31/12 0130 

## 2013-01-16 ENCOUNTER — Encounter: Payer: Self-pay | Admitting: Advanced Practice Midwife

## 2013-01-16 ENCOUNTER — Ambulatory Visit (INDEPENDENT_AMBULATORY_CARE_PROVIDER_SITE_OTHER): Payer: BC Managed Care – PPO | Admitting: General Practice

## 2013-01-16 DIAGNOSIS — Z3201 Encounter for pregnancy test, result positive: Secondary | ICD-10-CM

## 2013-01-16 LAB — HIV ANTIBODY (ROUTINE TESTING W REFLEX): HIV: NONREACTIVE

## 2013-01-16 NOTE — Progress Notes (Signed)
Patient had + home preg test, here today to confirm; would like to start care here. LMP 11/5, has had irregular periods lately due to stopping birth control.

## 2013-01-17 LAB — OBSTETRIC PANEL
Antibody Screen: NEGATIVE
Eosinophils Absolute: 0 10*3/uL (ref 0.0–0.7)
Eosinophils Relative: 1 % (ref 0–5)
Lymphs Abs: 2.3 10*3/uL (ref 0.7–4.0)
MCH: 26.6 pg (ref 26.0–34.0)
MCV: 79.7 fL (ref 78.0–100.0)
Monocytes Relative: 11 % (ref 3–12)
Platelets: 260 10*3/uL (ref 150–400)
RBC: 4.29 MIL/uL (ref 3.87–5.11)
Rh Type: POSITIVE

## 2013-01-18 LAB — HEMOGLOBINOPATHY EVALUATION
Hemoglobin Other: 0 %
Hgb A2 Quant: 2.5 % (ref 2.2–3.2)
Hgb S Quant: 0 %

## 2013-01-18 LAB — PRESCRIPTION MONITORING PROFILE (19 PANEL)
Benzodiazepine Screen, Urine: NEGATIVE ng/mL
Buprenorphine, Urine: NEGATIVE ng/mL
Cocaine Metabolites: NEGATIVE ng/mL
Fentanyl, Ur: NEGATIVE ng/mL
MDMA URINE: NEGATIVE ng/mL
Meperidine, Ur: NEGATIVE ng/mL
Methaqualone: NEGATIVE ng/mL
Zolpidem, Urine: NEGATIVE ng/mL

## 2013-01-18 LAB — CULTURE, OB URINE: Colony Count: NO GROWTH

## 2013-01-18 LAB — CANNABANOIDS (GC/LC/MS), URINE: THC-COOH (GC/LC/MS), ur confirm: 59 ng/mL — AB

## 2013-01-21 ENCOUNTER — Ambulatory Visit (HOSPITAL_COMMUNITY)
Admission: RE | Admit: 2013-01-21 | Discharge: 2013-01-21 | Disposition: A | Discharge status: Home / Self Care | Payer: BC Managed Care – PPO | Source: Ambulatory Visit | Attending: Obstetrics & Gynecology | Admitting: Obstetrics & Gynecology

## 2013-01-21 DIAGNOSIS — Z3201 Encounter for pregnancy test, result positive: Secondary | ICD-10-CM

## 2013-01-21 DIAGNOSIS — Z3689 Encounter for other specified antenatal screening: Secondary | ICD-10-CM | POA: Insufficient documentation

## 2013-01-31 NOTE — L&D Delivery Note (Signed)
I was present for the entire delivery and agree with above.  [place], [place] 09/22/2013 6:53 AM

## 2013-01-31 NOTE — L&D Delivery Note (Signed)
Delivery Note At 5:29 AM a healthy female was delivered via Vaginal, Spontaneous Delivery (Presentation: Right Occiput Anterior).  APGAR: 7, 9; weight .   Placenta status: Intact, Spontaneous.  Cord: 3 vessels with the following complications: None.  Cord pH: not obtained  Anesthesia: Epidural  Episiotomy: None Lacerations: 1st degree perineal  Suture Repair: 3.0 vicryl rapide Est. Blood Loss ( 400 mL):   Upon arrival patient was complete and pushing. She pushed with good maternal effort to deliver a healthy girl. Baby delivered without difficulty, was noted to have good tone and place on maternal abdomen for oral suctioning, drying and stimulation. Delayed cord clamping performed and cut by FOB. Placenta delivered intact with 3V cord. Pitocin was started and uterus massaged until bleeding slowed; Cytotec 1000 mg given rectally.  Vaginal canal and perineum was inspected and 1st degree laceration was found and repaired in the usual fashion by [place] which was hemostatic on completion. Counts of sharps, instruments, and lap pads were all correct.    Mom to postpartum.  Baby to Couplet care / Skin to Skin.  [name] 09/22/2013, 5:49 AM

## 2013-02-12 ENCOUNTER — Ambulatory Visit (INDEPENDENT_AMBULATORY_CARE_PROVIDER_SITE_OTHER): Payer: Medicaid Other | Admitting: Family

## 2013-02-12 ENCOUNTER — Encounter: Payer: Self-pay | Admitting: Family

## 2013-02-12 VITALS — BP 99/60 | Temp 97.0°F | Wt 142.6 lb

## 2013-02-12 DIAGNOSIS — Z3490 Encounter for supervision of normal pregnancy, unspecified, unspecified trimester: Secondary | ICD-10-CM

## 2013-02-12 DIAGNOSIS — Z3480 Encounter for supervision of other normal pregnancy, unspecified trimester: Secondary | ICD-10-CM

## 2013-02-12 LAB — POCT URINALYSIS DIP (DEVICE)
Bilirubin Urine: NEGATIVE
GLUCOSE, UA: NEGATIVE mg/dL
HGB URINE DIPSTICK: NEGATIVE
Ketones, ur: NEGATIVE mg/dL
Nitrite: NEGATIVE
PH: 8 (ref 5.0–8.0)
Protein, ur: NEGATIVE mg/dL
SPECIFIC GRAVITY, URINE: 1.015 (ref 1.005–1.030)
UROBILINOGEN UA: 0.2 mg/dL (ref 0.0–1.0)

## 2013-02-12 NOTE — Progress Notes (Signed)
P=67  Initial prenatal visit, labs drawn on last visit.

## 2013-02-12 NOTE — Progress Notes (Signed)
   Subjective:    Kelli Jimenez is a G2P0010 4420w6d being seen today for her first obstetrical visit.  Her obstetrical history is significant for marijuana use. Patient does intend to breast feed. Pregnancy history fully reviewed.  Patient reports nausea, no bleeding and no cramping.  Filed Vitals:   02/12/13 1304  BP: 99/60  Temp: 97 F (36.1 C)  Weight: 142 lb 9.6 oz (64.683 kg)    HISTORY: OB History  Gravida Para Term Preterm AB SAB TAB Ectopic Multiple Living  2    1  1        # Outcome Date GA Lbr Len/2nd Weight Sex Delivery Anes PTL Lv  2 CUR           1 TAB              Past Medical History  Diagnosis Date  . Ovarian cyst 2013  . Abscess     Bartholin's Cyst (right)x2 2013   Past Surgical History  Procedure Laterality Date  . Therapeutic abortion     History reviewed. No pertinent family history.   Exam   Exam   BP 99/60  Temp(Src) 97 F (36.1 C)  Wt 142 lb 9.6 oz (64.683 kg)  LMP 12/05/2012 Uterine Size: size equals dates  Pelvic Exam:   System: Breast:  No nipple retraction or dimpling, No nipple discharge or bleeding, No axillary or supraclavicular adenopathy, Normal to palpation without dominant masses   Skin: normal coloration and turgor, no rashes    Neurologic: negative   Extremities: normal strength, tone, and muscle mass   HEENT neck supple with midline trachea and thyroid without masses   Mouth/Teeth mucous membranes moist, pharynx normal without lesions   Neck supple and no masses   Cardiovascular: regular rate and rhythm, no murmurs or gallops   Respiratory:  appears well, vitals normal, no respiratory distress, acyanotic, normal RR, neck free of mass or lymphadenopathy, chest clear, no wheezing, crepitations, rhonchi, normal symmetric air entry   Abdomen: soft, non-tender; bowel sounds normal; no masses,  no organomegaly   Urinary: urethral meatus normal       Assessment:    Pregnancy: G2P0010 Supervision of Normal Pregnancy at [redacted]  wk gestation There are no active problems to display for this patient.       Plan:     Reviewed prenatal labs. Prenatal vitamins. Problem list reviewed and updated. Genetic Screening discussed First Screen: requested.  Follow up in 4 weeks.   [facility],Smitty Ackerley 02/12/2013

## 2013-03-06 ENCOUNTER — Ambulatory Visit (HOSPITAL_COMMUNITY)
Admission: RE | Admit: 2013-03-06 | Discharge: 2013-03-06 | Disposition: A | Discharge status: Home / Self Care | Payer: Medicaid Other | Source: Ambulatory Visit | Attending: Family | Admitting: Family

## 2013-03-06 ENCOUNTER — Other Ambulatory Visit: Payer: Self-pay

## 2013-03-06 DIAGNOSIS — Z3689 Encounter for other specified antenatal screening: Secondary | ICD-10-CM | POA: Insufficient documentation

## 2013-03-06 DIAGNOSIS — Z3490 Encounter for supervision of normal pregnancy, unspecified, unspecified trimester: Secondary | ICD-10-CM

## 2013-03-06 DIAGNOSIS — O351XX0 Maternal care for (suspected) chromosomal abnormality in fetus, not applicable or unspecified: Secondary | ICD-10-CM | POA: Insufficient documentation

## 2013-03-06 DIAGNOSIS — O3510X0 Maternal care for (suspected) chromosomal abnormality in fetus, unspecified, not applicable or unspecified: Secondary | ICD-10-CM | POA: Insufficient documentation

## 2013-03-09 ENCOUNTER — Encounter: Payer: Self-pay | Admitting: Family

## 2013-03-12 ENCOUNTER — Ambulatory Visit (INDEPENDENT_AMBULATORY_CARE_PROVIDER_SITE_OTHER): Payer: BC Managed Care – PPO | Admitting: Family Medicine

## 2013-03-12 VITALS — BP 107/70 | Temp 97.0°F | Wt 148.8 lb

## 2013-03-12 DIAGNOSIS — Z3490 Encounter for supervision of normal pregnancy, unspecified, unspecified trimester: Secondary | ICD-10-CM

## 2013-03-12 DIAGNOSIS — Z3480 Encounter for supervision of other normal pregnancy, unspecified trimester: Secondary | ICD-10-CM

## 2013-03-12 LAB — POCT URINALYSIS DIP (DEVICE)
BILIRUBIN URINE: NEGATIVE
Glucose, UA: NEGATIVE mg/dL
HGB URINE DIPSTICK: NEGATIVE
KETONES UR: NEGATIVE mg/dL
NITRITE: NEGATIVE
PH: 7.5 (ref 5.0–8.0)
PROTEIN: NEGATIVE mg/dL
Specific Gravity, Urine: 1.02 (ref 1.005–1.030)
Urobilinogen, UA: 0.2 mg/dL (ref 0.0–1.0)

## 2013-03-12 NOTE — Progress Notes (Signed)
Pulse: 82

## 2013-03-12 NOTE — Progress Notes (Signed)
S: 23 yo G2P0010 @ 2142w6d here for ROBV - no vb - no nausea, vomiting.   O: see flowsheet  A/P - doing well  - no concerns - bleeding precautions discussed - will schedule US at next visit.   - will need AFP at next visit also- 1st trimester US reviewed

## 2013-03-12 NOTE — Patient Instructions (Signed)
Second Trimester of Pregnancy The second trimester is from week 13 through week 28, months 4 through 6. The second trimester is often a time when you feel your best. Your body has also adjusted to being pregnant, and you begin to feel better physically. Usually, morning sickness has lessened or quit completely, you may have more energy, and you may have an increase in appetite. The second trimester is also a time when the fetus is growing rapidly. At the end of the sixth month, the fetus is about 9 inches long and weighs about 1 pounds. You will likely begin to feel the baby move (quickening) between 18 and 20 weeks of the pregnancy. BODY CHANGES Your body goes through many changes during pregnancy. The changes vary from woman to woman.   Your weight will continue to increase. You will notice your lower abdomen bulging out.  You may begin to get stretch marks on your hips, abdomen, and breasts.  You may develop headaches that can be relieved by medicines approved by your caregiver.  You may urinate more often because the fetus is pressing on your bladder.  You may develop or continue to have heartburn as a result of your pregnancy.  You may develop constipation because certain hormones are causing the muscles that push waste through your intestines to slow down.  You may develop hemorrhoids or swollen, bulging veins (varicose veins).  You may have back pain because of the weight gain and pregnancy hormones relaxing your joints between the bones in your pelvis and as a result of a shift in weight and the muscles that support your balance.  Your breasts will continue to grow and be tender.  Your gums may bleed and may be sensitive to brushing and flossing.  Dark spots or blotches (chloasma, mask of pregnancy) may develop on your face. This will likely fade after the baby is born.  A dark line from your belly button to the pubic area (linea nigra) may appear. This will likely fade after the  baby is born. WHAT TO EXPECT AT YOUR PRENATAL VISITS During a routine prenatal visit:  You will be weighed to make sure you and the fetus are growing normally.  Your blood pressure will be taken.  Your abdomen will be measured to track your baby's growth.  The fetal heartbeat will be listened to.  Any test results from the previous visit will be discussed. Your caregiver may ask you:  How you are feeling.  If you are feeling the baby move.  If you have had any abnormal symptoms, such as leaking fluid, bleeding, severe headaches, or abdominal cramping.  If you have any questions. Other tests that may be performed during your second trimester include:  Blood tests that check for:  Low iron levels (anemia).  Gestational diabetes (between 24 and 28 weeks).  Rh antibodies.  Urine tests to check for infections, diabetes, or protein in the urine.  An ultrasound to confirm the proper growth and development of the baby.  An amniocentesis to check for possible genetic problems.  Fetal screens for spina bifida and Down syndrome. HOME CARE INSTRUCTIONS   Avoid all smoking, herbs, alcohol, and unprescribed drugs. These chemicals affect the formation and growth of the baby.  Follow your caregiver's instructions regarding medicine use. There are medicines that are either safe or unsafe to take during pregnancy.  Exercise only as directed by your caregiver. Experiencing uterine cramps is a good sign to stop exercising.  Continue to eat regular,   healthy meals.  Wear a good support bra for breast tenderness.  Do not use hot tubs, steam rooms, or saunas.  Wear your seat belt at all times when driving.  Avoid raw meat, uncooked cheese, cat litter boxes, and soil used by cats. These carry germs that can cause birth defects in the baby.  Take your prenatal vitamins.  Try taking a stool softener (if your caregiver approves) if you develop constipation. Eat more high-fiber foods,  such as fresh vegetables or fruit and whole grains. Drink plenty of fluids to keep your urine clear or pale yellow.  Take warm sitz baths to soothe any pain or discomfort caused by hemorrhoids. Use hemorrhoid cream if your caregiver approves.  If you develop varicose veins, wear support hose. Elevate your feet for 15 minutes, 3 4 times a day. Limit salt in your diet.  Avoid heavy lifting, wear low heel shoes, and practice good posture.  Rest with your legs elevated if you have leg cramps or low back pain.  Visit your dentist if you have not gone yet during your pregnancy. Use a soft toothbrush to brush your teeth and be gentle when you floss.  A sexual relationship may be continued unless your caregiver directs you otherwise.  Continue to go to all your prenatal visits as directed by your caregiver. SEEK MEDICAL CARE IF:   You have dizziness.  You have mild pelvic cramps, pelvic pressure, or nagging pain in the abdominal area.  You have persistent nausea, vomiting, or diarrhea.  You have a bad smelling vaginal discharge.  You have pain with urination. SEEK IMMEDIATE MEDICAL CARE IF:   You have a fever.  You are leaking fluid from your vagina.  You have spotting or bleeding from your vagina.  You have severe abdominal cramping or pain.  You have rapid weight gain or loss.  You have shortness of breath with chest pain.  You notice sudden or extreme swelling of your face, hands, ankles, feet, or legs.  You have not felt your baby move in over an hour.  You have severe headaches that do not go away with medicine.  You have vision changes. Document Released: 01/11/2001 Document Revised: 09/19/2012 Document Reviewed: 03/20/2012 [employer] Patient Information 2014 [employer].  

## 2013-04-09 ENCOUNTER — Ambulatory Visit (INDEPENDENT_AMBULATORY_CARE_PROVIDER_SITE_OTHER): Payer: Medicaid Other | Admitting: Family

## 2013-04-09 VITALS — BP 102/67 | Temp 97.1°F | Wt 157.5 lb

## 2013-04-09 DIAGNOSIS — Z3490 Encounter for supervision of normal pregnancy, unspecified, unspecified trimester: Secondary | ICD-10-CM

## 2013-04-09 DIAGNOSIS — Z3480 Encounter for supervision of other normal pregnancy, unspecified trimester: Secondary | ICD-10-CM

## 2013-04-09 LAB — POCT URINALYSIS DIP (DEVICE)
BILIRUBIN URINE: NEGATIVE
GLUCOSE, UA: NEGATIVE mg/dL
HGB URINE DIPSTICK: NEGATIVE
NITRITE: NEGATIVE
PH: 8.5 — AB (ref 5.0–8.0)
Protein, ur: 30 mg/dL — AB
SPECIFIC GRAVITY, URINE: 1.02 (ref 1.005–1.030)
Urobilinogen, UA: 1 mg/dL (ref 0.0–1.0)

## 2013-04-09 MED ORDER — FLUCONAZOLE 150 MG PO TABS: 150.0000 mg | ORAL_TABLET | Freq: Once | ORAL | Status: DC

## 2013-04-09 NOTE — Progress Notes (Signed)
Doing well; +vaginal itching and white discharge, wet prep obtained. RX diflucan.  Scheduled anatomy ultrasound in one week.  AFP obtained today.

## 2013-04-09 NOTE — Progress Notes (Signed)
P=95  Pt reports itching, discharge like cottage cheese

## 2013-04-10 LAB — CULTURE, OB URINE
Colony Count: NO GROWTH
ORGANISM ID, BACTERIA: NO GROWTH

## 2013-04-10 LAB — WET PREP, GENITAL
Clue Cells Wet Prep HPF POC: NONE SEEN
Trich, Wet Prep: NONE SEEN
WBC, Wet Prep HPF POC: NONE SEEN

## 2013-04-11 ENCOUNTER — Encounter: Payer: Self-pay | Admitting: Family

## 2013-04-11 LAB — ALPHA FETOPROTEIN, MATERNAL
AFP: 33.6 IU/mL
CURR GEST AGE: 17.6 wks.days
MOM FOR AFP: 0.81
Open Spina bifida: NEGATIVE

## 2013-04-16 ENCOUNTER — Ambulatory Visit (HOSPITAL_COMMUNITY)
Admission: RE | Admit: 2013-04-16 | Discharge: 2013-04-16 | Disposition: A | Discharge status: Home / Self Care | Payer: BC Managed Care – PPO | Source: Ambulatory Visit | Attending: Family | Admitting: Family

## 2013-04-16 DIAGNOSIS — Z3490 Encounter for supervision of normal pregnancy, unspecified, unspecified trimester: Secondary | ICD-10-CM

## 2013-04-16 DIAGNOSIS — Z3689 Encounter for other specified antenatal screening: Secondary | ICD-10-CM | POA: Insufficient documentation

## 2013-04-17 ENCOUNTER — Encounter: Payer: Self-pay | Admitting: Family

## 2013-05-07 ENCOUNTER — Ambulatory Visit (INDEPENDENT_AMBULATORY_CARE_PROVIDER_SITE_OTHER): Payer: BC Managed Care – PPO | Admitting: Family Medicine

## 2013-05-07 ENCOUNTER — Encounter: Payer: Self-pay | Admitting: Family Medicine

## 2013-05-07 VITALS — Wt 164.9 lb

## 2013-05-07 DIAGNOSIS — Z3490 Encounter for supervision of normal pregnancy, unspecified, unspecified trimester: Secondary | ICD-10-CM

## 2013-05-07 DIAGNOSIS — Z3480 Encounter for supervision of other normal pregnancy, unspecified trimester: Secondary | ICD-10-CM | POA: Diagnosis not present

## 2013-05-07 NOTE — Patient Instructions (Signed)
Second Trimester of Pregnancy The second trimester is from week 13 through week 28, months 4 through 6. The second trimester is often a time when you feel your best. Your body has also adjusted to being pregnant, and you begin to feel better physically. Usually, morning sickness has lessened or quit completely, you may have more energy, and you may have an increase in appetite. The second trimester is also a time when the fetus is growing rapidly. At the end of the sixth month, the fetus is about 9 inches long and weighs about 1 pounds. You will likely begin to feel the baby move (quickening) between 18 and 20 weeks of the pregnancy. BODY CHANGES Your body goes through many changes during pregnancy. The changes vary from woman to woman.   Your weight will continue to increase. You will notice your lower abdomen bulging out.  You may begin to get stretch marks on your hips, abdomen, and breasts.  You may develop headaches that can be relieved by medicines approved by your caregiver.  You may urinate more often because the fetus is pressing on your bladder.  You may develop or continue to have heartburn as a result of your pregnancy.  You may develop constipation because certain hormones are causing the muscles that push waste through your intestines to slow down.  You may develop hemorrhoids or swollen, bulging veins (varicose veins).  You may have back pain because of the weight gain and pregnancy hormones relaxing your joints between the bones in your pelvis and as a result of a shift in weight and the muscles that support your balance.  Your breasts will continue to grow and be tender.  Your gums may bleed and may be sensitive to brushing and flossing.  Dark spots or blotches (chloasma, mask of pregnancy) may develop on your face. This will likely fade after the baby is born.  A dark line from your belly button to the pubic area (linea nigra) may appear. This will likely fade after the  baby is born. WHAT TO EXPECT AT YOUR PRENATAL VISITS During a routine prenatal visit:  You will be weighed to make sure you and the fetus are growing normally.  Your blood pressure will be taken.  Your abdomen will be measured to track your baby's growth.  The fetal heartbeat will be listened to.  Any test results from the previous visit will be discussed. Your caregiver may ask you:  How you are feeling.  If you are feeling the baby move.  If you have had any abnormal symptoms, such as leaking fluid, bleeding, severe headaches, or abdominal cramping.  If you have any questions. Other tests that may be performed during your second trimester include:  Blood tests that check for:  Low iron levels (anemia).  Gestational diabetes (between 24 and 28 weeks).  Rh antibodies.  Urine tests to check for infections, diabetes, or protein in the urine.  An ultrasound to confirm the proper growth and development of the baby.  An amniocentesis to check for possible genetic problems.  Fetal screens for spina bifida and Down syndrome. HOME CARE INSTRUCTIONS   Avoid all smoking, herbs, alcohol, and unprescribed drugs. These chemicals affect the formation and growth of the baby.  Follow your caregiver's instructions regarding medicine use. There are medicines that are either safe or unsafe to take during pregnancy.  Exercise only as directed by your caregiver. Experiencing uterine cramps is a good sign to stop exercising.  Continue to eat regular,   healthy meals.  Wear a good support bra for breast tenderness.  Do not use hot tubs, steam rooms, or saunas.  Wear your seat belt at all times when driving.  Avoid raw meat, uncooked cheese, cat litter boxes, and soil used by cats. These carry germs that can cause birth defects in the baby.  Take your prenatal vitamins.  Try taking a stool softener (if your caregiver approves) if you develop constipation. Eat more high-fiber foods,  such as fresh vegetables or fruit and whole grains. Drink plenty of fluids to keep your urine clear or pale yellow.  Take warm sitz baths to soothe any pain or discomfort caused by hemorrhoids. Use hemorrhoid cream if your caregiver approves.  If you develop varicose veins, wear support hose. Elevate your feet for 15 minutes, 3 4 times a day. Limit salt in your diet.  Avoid heavy lifting, wear low heel shoes, and practice good posture.  Rest with your legs elevated if you have leg cramps or low back pain.  Visit your dentist if you have not gone yet during your pregnancy. Use a soft toothbrush to brush your teeth and be gentle when you floss.  A sexual relationship may be continued unless your caregiver directs you otherwise.  Continue to go to all your prenatal visits as directed by your caregiver. SEEK MEDICAL CARE IF:   You have dizziness.  You have mild pelvic cramps, pelvic pressure, or nagging pain in the abdominal area.  You have persistent nausea, vomiting, or diarrhea.  You have a bad smelling vaginal discharge.  You have pain with urination. SEEK IMMEDIATE MEDICAL CARE IF:   You have a fever.  You are leaking fluid from your vagina.  You have spotting or bleeding from your vagina.  You have severe abdominal cramping or pain.  You have rapid weight gain or loss.  You have shortness of breath with chest pain.  You notice sudden or extreme swelling of your face, hands, ankles, feet, or legs.  You have not felt your baby move in over an hour.  You have severe headaches that do not go away with medicine.  You have vision changes. Document Released: 01/11/2001 Document Revised: 09/19/2012 Document Reviewed: 03/20/2012 [employer] Patient Information 2014 [employer].  

## 2013-05-07 NOTE — Progress Notes (Signed)
Pulse- 116  Pain/pressure- pelvic

## 2013-05-07 NOTE — Progress Notes (Signed)
+  FM, no lof, no vb, no ctx  No complaints Labs reviewed, Needs Gc/c ordered today  [name] Dunk is a 23 y.o. G2P0010 at 3853w6d by L=6 here for ROB visit.  Discussed with Patient:  -Plans to breast feed.  All questions answered. -Continue prenatal vitamins. - Reviewed genetics screen (Quad screen / first trimester screen / serum integrated screen / full integrated screen done/ not done).   - Routine precautions discussed (depression, infection s/s).   Patient provided with all pertinent phone numbers for emergencies. - RTC for any VB, regular, painful cramps/ctxs occurring at a rate of >2/10 min, fever (100.5 or higher), n/v/d, any pain that is unresolving or worsening, LOF, decreased fetal movement, CP, SOB, edema  Problems: Patient Active Problem List   Diagnosis Date Noted  . Supervision of normal pregnancy 02/12/2013    To Do:   [ ]  Vaccines: ZOX:WRUEAVWFlu:decline  Tdap:  [ ]  BCM: mirnena

## 2013-05-08 LAB — GC/CHLAMYDIA PROBE AMP
CT Probe RNA: NEGATIVE
GC Probe RNA: NEGATIVE

## 2013-06-05 ENCOUNTER — Encounter: Payer: Self-pay | Admitting: Advanced Practice Midwife

## 2013-06-05 ENCOUNTER — Ambulatory Visit (INDEPENDENT_AMBULATORY_CARE_PROVIDER_SITE_OTHER): Payer: BC Managed Care – PPO | Admitting: Advanced Practice Midwife

## 2013-06-05 VITALS — BP 101/68 | HR 84 | Temp 97.6°F | Wt 176.5 lb

## 2013-06-05 DIAGNOSIS — Z3490 Encounter for supervision of normal pregnancy, unspecified, unspecified trimester: Secondary | ICD-10-CM

## 2013-06-05 DIAGNOSIS — Z3480 Encounter for supervision of other normal pregnancy, unspecified trimester: Secondary | ICD-10-CM

## 2013-06-05 LAB — POCT URINALYSIS DIP (DEVICE)
BILIRUBIN URINE: NEGATIVE
GLUCOSE, UA: NEGATIVE mg/dL
HGB URINE DIPSTICK: NEGATIVE
KETONES UR: NEGATIVE mg/dL
Nitrite: NEGATIVE
Protein, ur: NEGATIVE mg/dL
SPECIFIC GRAVITY, URINE: 1.015 (ref 1.005–1.030)
Urobilinogen, UA: 0.2 mg/dL (ref 0.0–1.0)
pH: 7 (ref 5.0–8.0)

## 2013-06-05 MED ORDER — TERCONAZOLE 0.4 % VA CREA: 1.0000 | TOPICAL_CREAM | Freq: Every day | VAGINAL | Status: DC

## 2013-06-05 NOTE — Progress Notes (Signed)
Pt feels like she has a yeast infection

## 2013-06-05 NOTE — Patient Instructions (Signed)
Second Trimester of Pregnancy The second trimester is from week 13 through week 28, months 4 through 6. The second trimester is often a time when you feel your best. Your body has also adjusted to being pregnant, and you begin to feel better physically. Usually, morning sickness has lessened or quit completely, you may have more energy, and you may have an increase in appetite. The second trimester is also a time when the fetus is growing rapidly. At the end of the sixth month, the fetus is about 9 inches long and weighs about 1 pounds. You will likely begin to feel the baby move (quickening) between 18 and 20 weeks of the pregnancy. BODY CHANGES Your body goes through many changes during pregnancy. The changes vary from woman to woman.   Your weight will continue to increase. You will notice your lower abdomen bulging out.  You may begin to get stretch marks on your hips, abdomen, and breasts.  You may develop headaches that can be relieved by medicines approved by your caregiver.  You may urinate more often because the fetus is pressing on your bladder.  You may develop or continue to have heartburn as a result of your pregnancy.  You may develop constipation because certain hormones are causing the muscles that push waste through your intestines to slow down.  You may develop hemorrhoids or swollen, bulging veins (varicose veins).  You may have back pain because of the weight gain and pregnancy hormones relaxing your joints between the bones in your pelvis and as a result of a shift in weight and the muscles that support your balance.  Your breasts will continue to grow and be tender.  Your gums may bleed and may be sensitive to brushing and flossing.  Dark spots or blotches (chloasma, mask of pregnancy) may develop on your face. This will likely fade after the baby is born.  A dark line from your belly button to the pubic area (linea nigra) may appear. This will likely fade after the  baby is born. WHAT TO EXPECT AT YOUR PRENATAL VISITS During a routine prenatal visit:  You will be weighed to make sure you and the fetus are growing normally.  Your blood pressure will be taken.  Your abdomen will be measured to track your baby's growth.  The fetal heartbeat will be listened to.  Any test results from the previous visit will be discussed. Your caregiver may ask you:  How you are feeling.  If you are feeling the baby move.  If you have had any abnormal symptoms, such as leaking fluid, bleeding, severe headaches, or abdominal cramping.  If you have any questions. Other tests that may be performed during your second trimester include:  Blood tests that check for:  Low iron levels (anemia).  Gestational diabetes (between 24 and 28 weeks).  Rh antibodies.  Urine tests to check for infections, diabetes, or protein in the urine.  An ultrasound to confirm the proper growth and development of the baby.  An amniocentesis to check for possible genetic problems.  Fetal screens for spina bifida and Down syndrome. HOME CARE INSTRUCTIONS   Avoid all smoking, herbs, alcohol, and unprescribed drugs. These chemicals affect the formation and growth of the baby.  Follow your caregiver's instructions regarding medicine use. There are medicines that are either safe or unsafe to take during pregnancy.  Exercise only as directed by your caregiver. Experiencing uterine cramps is a good sign to stop exercising.  Continue to eat regular,   healthy meals.  Wear a good support bra for breast tenderness.  Do not use hot tubs, steam rooms, or saunas.  Wear your seat belt at all times when driving.  Avoid raw meat, uncooked cheese, cat litter boxes, and soil used by cats. These carry germs that can cause birth defects in the baby.  Take your prenatal vitamins.  Try taking a stool softener (if your caregiver approves) if you develop constipation. Eat more high-fiber foods,  such as fresh vegetables or fruit and whole grains. Drink plenty of fluids to keep your urine clear or pale yellow.  Take warm sitz baths to soothe any pain or discomfort caused by hemorrhoids. Use hemorrhoid cream if your caregiver approves.  If you develop varicose veins, wear support hose. Elevate your feet for 15 minutes, 3 4 times a day. Limit salt in your diet.  Avoid heavy lifting, wear low heel shoes, and practice good posture.  Rest with your legs elevated if you have leg cramps or low back pain.  Visit your dentist if you have not gone yet during your pregnancy. Use a soft toothbrush to brush your teeth and be gentle when you floss.  A sexual relationship may be continued unless your caregiver directs you otherwise.  Continue to go to all your prenatal visits as directed by your caregiver. SEEK MEDICAL CARE IF:   You have dizziness.  You have mild pelvic cramps, pelvic pressure, or nagging pain in the abdominal area.  You have persistent nausea, vomiting, or diarrhea.  You have a bad smelling vaginal discharge.  You have pain with urination. SEEK IMMEDIATE MEDICAL CARE IF:   You have a fever.  You are leaking fluid from your vagina.  You have spotting or bleeding from your vagina.  You have severe abdominal cramping or pain.  You have rapid weight gain or loss.  You have shortness of breath with chest pain.  You notice sudden or extreme swelling of your face, hands, ankles, feet, or legs.  You have not felt your baby move in over an hour.  You have severe headaches that do not go away with medicine.  You have vision changes. Document Released: 01/11/2001 Document Revised: 09/19/2012 Document Reviewed: 03/20/2012 [employer] Patient Information 2014 [employer].  

## 2013-06-05 NOTE — Progress Notes (Signed)
Doing well. Needs Glucola in 2 weeks.

## 2013-06-18 ENCOUNTER — Encounter: Payer: Medicaid Other | Admitting: Advanced Practice Midwife

## 2013-06-19 ENCOUNTER — Ambulatory Visit (INDEPENDENT_AMBULATORY_CARE_PROVIDER_SITE_OTHER): Payer: BC Managed Care – PPO | Admitting: Family Medicine

## 2013-06-19 ENCOUNTER — Encounter: Payer: Self-pay | Admitting: Family Medicine

## 2013-06-19 VITALS — BP 111/73 | HR 93 | Wt 177.4 lb

## 2013-06-19 DIAGNOSIS — Z23 Encounter for immunization: Secondary | ICD-10-CM

## 2013-06-19 DIAGNOSIS — Z3490 Encounter for supervision of normal pregnancy, unspecified, unspecified trimester: Secondary | ICD-10-CM

## 2013-06-19 DIAGNOSIS — Z3480 Encounter for supervision of other normal pregnancy, unspecified trimester: Secondary | ICD-10-CM

## 2013-06-19 LAB — POCT URINALYSIS DIP (DEVICE)
BILIRUBIN URINE: NEGATIVE
GLUCOSE, UA: NEGATIVE mg/dL
Hgb urine dipstick: NEGATIVE
Ketones, ur: NEGATIVE mg/dL
NITRITE: NEGATIVE
Protein, ur: NEGATIVE mg/dL
SPECIFIC GRAVITY, URINE: 1.025 (ref 1.005–1.030)
Urobilinogen, UA: 0.2 mg/dL (ref 0.0–1.0)
pH: 7 (ref 5.0–8.0)

## 2013-06-19 MED ORDER — TETANUS-DIPHTH-ACELL PERTUSSIS 5-2.5-18.5 LF-MCG/0.5 IM SUSP
0.5000 mL | Freq: Once | INTRAMUSCULAR | Status: AC
  Administered 2013-06-19: 0.5 mL via INTRAMUSCULAR

## 2013-06-19 NOTE — Patient Instructions (Signed)
Third Trimester of Pregnancy  The third trimester is from week 29 through week 42, months 7 through 9. The third trimester is a time when the fetus is growing rapidly. At the end of the ninth month, the fetus is about 20 inches in length and weighs 6 10 pounds.   BODY CHANGES  Your body goes through many changes during pregnancy. The changes vary from woman to woman.    Your weight will continue to increase. You can expect to gain 25 35 pounds (11 16 kg) by the end of the pregnancy.   You may begin to get stretch marks on your hips, abdomen, and breasts.   You may urinate more often because the fetus is moving lower into your pelvis and pressing on your bladder.   You may develop or continue to have heartburn as a result of your pregnancy.   You may develop constipation because certain hormones are causing the muscles that push waste through your intestines to slow down.   You may develop hemorrhoids or swollen, bulging veins (varicose veins).   You may have pelvic pain because of the weight gain and pregnancy hormones relaxing your joints between the bones in your pelvis. Back aches may result from over exertion of the muscles supporting your posture.   Your breasts will continue to grow and be tender. A yellow discharge may leak from your breasts called colostrum.   Your belly button may stick out.   You may feel short of breath because of your expanding uterus.   You may notice the fetus "dropping," or moving lower in your abdomen.   You may have a bloody mucus discharge. This usually occurs a few days to a week before labor begins.   Your cervix becomes thin and soft (effaced) near your due date.  WHAT TO EXPECT AT YOUR PRENATAL EXAMS   You will have prenatal exams every 2 weeks until week 36. Then, you will have weekly prenatal exams. During a routine prenatal visit:   You will be weighed to make sure you and the fetus are growing normally.   Your blood pressure is taken.   Your abdomen will be  measured to track your baby's growth.   The fetal heartbeat will be listened to.   Any test results from the previous visit will be discussed.   You may have a cervical check near your due date to see if you have effaced.  At around 36 weeks, your caregiver will check your cervix. At the same time, your caregiver will also perform a test on the secretions of the vaginal tissue. This test is to determine if a type of bacteria, Group B streptococcus, is present. Your caregiver will explain this further.  Your caregiver may ask you:   What your birth plan is.   How you are feeling.   If you are feeling the baby move.   If you have had any abnormal symptoms, such as leaking fluid, bleeding, severe headaches, or abdominal cramping.   If you have any questions.  Other tests or screenings that may be performed during your third trimester include:   Blood tests that check for low iron levels (anemia).   Fetal testing to check the health, activity level, and growth of the fetus. Testing is done if you have certain medical conditions or if there are problems during the pregnancy.  FALSE LABOR  You may feel small, irregular contractions that eventually go away. These are called Braxton Hicks contractions, or   false labor. Contractions may last for hours, days, or even weeks before true labor sets in. If contractions come at regular intervals, intensify, or become painful, it is best to be seen by your caregiver.   SIGNS OF LABOR    Menstrual-like cramps.   Contractions that are 5 minutes apart or less.   Contractions that start on the top of the uterus and spread down to the lower abdomen and back.   A sense of increased pelvic pressure or back pain.   A watery or bloody mucus discharge that comes from the vagina.  If you have any of these signs before the 37th week of pregnancy, call your caregiver right away. You need to go to the hospital to get checked immediately.  HOME CARE INSTRUCTIONS    Avoid all  smoking, herbs, alcohol, and unprescribed drugs. These chemicals affect the formation and growth of the baby.   Follow your caregiver's instructions regarding medicine use. There are medicines that are either safe or unsafe to take during pregnancy.   Exercise only as directed by your caregiver. Experiencing uterine cramps is a good sign to stop exercising.   Continue to eat regular, healthy meals.   Wear a good support bra for breast tenderness.   Do not use hot tubs, steam rooms, or saunas.   Wear your seat belt at all times when driving.   Avoid raw meat, uncooked cheese, cat litter boxes, and soil used by cats. These carry germs that can cause birth defects in the baby.   Take your prenatal vitamins.   Try taking a stool softener (if your caregiver approves) if you develop constipation. Eat more high-fiber foods, such as fresh vegetables or fruit and whole grains. Drink plenty of fluids to keep your urine clear or pale yellow.   Take warm sitz baths to soothe any pain or discomfort caused by hemorrhoids. Use hemorrhoid cream if your caregiver approves.   If you develop varicose veins, wear support hose. Elevate your feet for 15 minutes, 3 4 times a day. Limit salt in your diet.   Avoid heavy lifting, wear low heal shoes, and practice good posture.   Rest a lot with your legs elevated if you have leg cramps or low back pain.   Visit your dentist if you have not gone during your pregnancy. Use a soft toothbrush to brush your teeth and be gentle when you floss.   A sexual relationship may be continued unless your caregiver directs you otherwise.   Do not travel far distances unless it is absolutely necessary and only with the approval of your caregiver.   Take prenatal classes to understand, practice, and ask questions about the labor and delivery.   Make a trial run to the hospital.   Pack your hospital bag.   Prepare the baby's nursery.   Continue to go to all your prenatal visits as directed  by your caregiver.  SEEK MEDICAL CARE IF:   You are unsure if you are in labor or if your water has broken.   You have dizziness.   You have mild pelvic cramps, pelvic pressure, or nagging pain in your abdominal area.   You have persistent nausea, vomiting, or diarrhea.   You have a bad smelling vaginal discharge.   You have pain with urination.  SEEK IMMEDIATE MEDICAL CARE IF:    You have a fever.   You are leaking fluid from your vagina.   You have spotting or bleeding from your vagina.     You have severe abdominal cramping or pain.   You have rapid weight loss or gain.   You have shortness of breath with chest pain.   You notice sudden or extreme swelling of your face, hands, ankles, feet, or legs.   You have not felt your baby move in over an hour.   You have severe headaches that do not go away with medicine.   You have vision changes.  Document Released: 01/11/2001 Document Revised: 09/19/2012 Document Reviewed: 03/20/2012  [employer] Patient Information 2014 [employer].

## 2013-06-19 NOTE — Progress Notes (Signed)
1hr today due at 1226. Would like tdap.

## 2013-06-19 NOTE — Progress Notes (Signed)
+  FM, No lof, no vb, no ctx No complaints  Kelli Jimenez is a 23 y.o. G2P0010 at 2126w0d by L=6 here for ROB visit.  Discussed with Patient:  -Plans to breast feed.  All questions answered. -Continue prenatal vitamins. -Reviewed fetal kick counts (Pt to perform daily at a time when the baby is active, lie laterally with both hands on belly in quiet room and count all movements (hiccups, shoulder rolls, obvious kicks, etc); pt is to report to clinic or MAU for less than 10 movements felt in a one hour time period-pt told as soon as she counts 10 movements the count is complete.)  - Routine precautions discussed (depression, infection s/s).   Patient provided with all pertinent phone numbers for emergencies. - RTC for any VB, regular, painful cramps/ctxs occurring at a rate of >2/10 min, fever (100.5 or higher), n/v/d, any pain that is unresolving or worsening, LOF, decreased fetal movement, CP, SOB, edema  Problems: Patient Active Problem List   Diagnosis Date Noted  . Supervision of normal pregnancy 02/12/2013    To Do: 1. Glucose tolerance test ordered.  Patient will draw in clinic.  Will f/u test and amend plan based on results. 2. Labs today  [ ]  Vaccines: Flu:  Tdap: today [ ]  BCM: mirena  Edu: [x ] PTL precautions; [ ]  BF class; [ ]  childbirth class; [ ]   BF counseling;

## 2013-06-20 LAB — CBC
HEMATOCRIT: 32.5 % — AB (ref 36.0–46.0)
HEMOGLOBIN: 10.7 g/dL — AB (ref 12.0–15.0)
MCH: 26 pg (ref 26.0–34.0)
MCHC: 32.9 g/dL (ref 30.0–36.0)
MCV: 78.9 fL (ref 78.0–100.0)
Platelets: 233 10*3/uL (ref 150–400)
RBC: 4.12 MIL/uL (ref 3.87–5.11)
RDW: 16.2 % — ABNORMAL HIGH (ref 11.5–15.5)
WBC: 7.2 10*3/uL (ref 4.0–10.5)

## 2013-06-20 LAB — GLUCOSE TOLERANCE, 1 HOUR (50G) W/O FASTING: Glucose, 1 Hour GTT: 95 mg/dL (ref 70–140)

## 2013-06-20 LAB — HIV ANTIBODY (ROUTINE TESTING W REFLEX): HIV 1&2 Ab, 4th Generation: NONREACTIVE

## 2013-06-20 LAB — RPR

## 2013-07-09 ENCOUNTER — Encounter (HOSPITAL_COMMUNITY): Payer: Self-pay

## 2013-07-09 ENCOUNTER — Inpatient Hospital Stay (HOSPITAL_COMMUNITY)
Admission: AD | Admit: 2013-07-09 | Discharge: 2013-07-09 | Disposition: A | Discharge status: Home / Self Care | Payer: BC Managed Care – PPO | Source: Ambulatory Visit | Attending: Obstetrics and Gynecology | Admitting: Obstetrics and Gynecology

## 2013-07-09 DIAGNOSIS — O99891 Other specified diseases and conditions complicating pregnancy: Secondary | ICD-10-CM | POA: Diagnosis not present

## 2013-07-09 DIAGNOSIS — N898 Other specified noninflammatory disorders of vagina: Secondary | ICD-10-CM | POA: Diagnosis not present

## 2013-07-09 DIAGNOSIS — R109 Unspecified abdominal pain: Secondary | ICD-10-CM

## 2013-07-09 DIAGNOSIS — Z3493 Encounter for supervision of normal pregnancy, unspecified, third trimester: Secondary | ICD-10-CM

## 2013-07-09 DIAGNOSIS — B079 Viral wart, unspecified: Secondary | ICD-10-CM | POA: Diagnosis not present

## 2013-07-09 DIAGNOSIS — Z3490 Encounter for supervision of normal pregnancy, unspecified, unspecified trimester: Secondary | ICD-10-CM

## 2013-07-09 DIAGNOSIS — O9989 Other specified diseases and conditions complicating pregnancy, childbirth and the puerperium: Principal | ICD-10-CM

## 2013-07-09 DIAGNOSIS — Z87891 Personal history of nicotine dependence: Secondary | ICD-10-CM | POA: Diagnosis not present

## 2013-07-09 HISTORY — DX: Anogenital (venereal) warts: A63.0

## 2013-07-09 LAB — URINALYSIS, ROUTINE W REFLEX MICROSCOPIC
Bilirubin Urine: NEGATIVE
GLUCOSE, UA: NEGATIVE mg/dL
HGB URINE DIPSTICK: NEGATIVE
Ketones, ur: 15 mg/dL — AB
LEUKOCYTES UA: NEGATIVE
Nitrite: NEGATIVE
Protein, ur: NEGATIVE mg/dL
SPECIFIC GRAVITY, URINE: 1.025 (ref 1.005–1.030)
UROBILINOGEN UA: 0.2 mg/dL (ref 0.0–1.0)
pH: 6.5 (ref 5.0–8.0)

## 2013-07-09 LAB — WET PREP, GENITAL
Clue Cells Wet Prep HPF POC: NONE SEEN
TRICH WET PREP: NONE SEEN
Yeast Wet Prep HPF POC: NONE SEEN

## 2013-07-09 MED ORDER — GI COCKTAIL ~~LOC~~
30.0000 mL | Freq: Once | ORAL | Status: AC
  Administered 2013-07-09: 30 mL via ORAL
  Filled 2013-07-09: qty 30

## 2013-07-09 NOTE — MAU Note (Signed)
Constant upper abdominal pain x 3 weeks, worse when sitting down. Denies vaginal bleeding. Pink & red "milky" discharge. Denies vaginal irritation; states discharge does not look like blood. Denies dysuria. Positive fetal movement.

## 2013-07-09 NOTE — Discharge Instructions (Signed)
Abdominal Pain During Pregnancy °Abdominal pain is common in pregnancy. Most of the time, it does not cause harm. There are many causes of abdominal pain. Some causes are more serious than others. Some of the causes of abdominal pain in pregnancy are easily diagnosed. Occasionally, the diagnosis takes time to understand. Other times, the cause is not determined. Abdominal pain can be a sign that something is very wrong with the pregnancy, or the pain may have nothing to do with the pregnancy at all. For this reason, always tell your health care provider if you have any abdominal discomfort. °HOME CARE INSTRUCTIONS  °Monitor your abdominal pain for any changes. The following actions may help to alleviate any discomfort you are experiencing: °· Do not have sexual intercourse or put anything in your vagina until your symptoms go away completely. °· Get plenty of rest until your pain improves. °· Drink clear fluids if you feel nauseous. Avoid solid food as long as you are uncomfortable or nauseous. °· Only take over-the-counter or prescription medicine as directed by your health care provider. °· Keep all follow-up appointments with your health care provider. °SEEK IMMEDIATE MEDICAL CARE IF: °· You are bleeding, leaking fluid, or passing tissue from the vagina. °· You have increasing pain or cramping. °· You have persistent vomiting. °· You have painful or bloody urination. °· You have a fever. °· You notice a decrease in your baby's movements. °· You have extreme weakness or feel faint. °· You have shortness of breath, with or without abdominal pain. °· You develop a severe headache with abdominal pain. °· You have abnormal vaginal discharge with abdominal pain. °· You have persistent diarrhea. °· You have abdominal pain that continues even after rest, or gets worse. °MAKE SURE YOU:  °· Understand these instructions. °· Will watch your condition. °· Will get help right away if you are not doing well or get  worse. °Document Released: 01/17/2005 Document Revised: 11/07/2012 Document Reviewed: 08/16/2012 °[employer]® Patient Information ©2014 [employer]. ° °

## 2013-07-09 NOTE — MAU Provider Note (Signed)
Chief Complaint:  Abdominal Pain and Vaginal Discharge   Kelli Jimenez is a 23 y.o.  G2P0010 with IUP at [redacted]w[redacted]d presenting for Abdominal Pain and Vaginal Discharge She's had a 2-3 week history of upper abdominal pain. The pain rates as 7-8/10. The pain is improved with eating. It is worse with when sitting up and before meals. The pain is achy and dull. The pain was getting worse over the weekend to where she didn't feel like eating. She hasn't had pain like this before. She hasn't had any previous abdominal surgery. She denies any generalized itching. She's had some nausea but no vomiting.  She had no fever but has been having diarrhea. The pain is epigastric in nature.   She had a pinkish-reddish discharge one time last night. She hasn't had any discharge like this previously. She denies any vaginal odor but does have discharge and itching. She had a previous yeast infection during this pregnancy.   She denies any LOB or vaginal bleeding.   Menstrual History: OB History   Grav Para Term Preterm Abortions TAB SAB Ect Mult Living   2    1 1            Patient's last menstrual period was 12/05/2012.      Past Medical History  Diagnosis Date  . Ovarian cyst 2013  . Abscess     Bartholin's Cyst (right)x2 2013  . 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 Therapeutic abortion      History reviewed. No pertinent family history.  History  Substance Use Topics  . Smoking status: Former Smoker    Types: Pipe    Quit date: 01/31/2010  . Smokeless tobacco: Never Used  . Alcohol Use: No     Comment: occassional, light     No Known Allergies  Prescriptions prior to admission  Medication Sig Dispense Refill  . Prenatal Vit-Fe Fumarate-FA (MULTIVITAMIN-PRENATAL) 27-0.8 MG TABS tablet Take 1 tablet by mouth daily at 12 noon.      Marland Kitchen terconazole (TERAZOL 7) 0.4 % vaginal cream Place 1 applicator vaginally at bedtime.  45 g   0    Review of Systems - Negative except for what is mentioned in HPI.  Physical Exam  Blood pressure 105/68, pulse 90, temperature 98.1 F (36.7 C), temperature source Oral, resp. rate 18, height 5\' 6"  (1.676 m), weight 84.369 kg (186 lb), last menstrual period 12/05/2012. GENERAL: Well-developed, well-nourished female in no acute distress.  HEART: Regular rate and rhythm. ABDOMEN: Soft, nontender, nondistended, gravid.  EXTREMITIES: Nontender, no edema, 2+ distal pulses. Cervical Exam: visually closed during speculum   FHT:  Baseline rate 140 bpm   Variability moderate  Accelerations present   Decelerations none Contractions: none   Labs: Results for orders placed during the hospital encounter of 07/09/13 (from the past 24 hour(s))  URINALYSIS, ROUTINE W REFLEX MICROSCOPIC   Collection Time    07/09/13  1:37 AM      Result Value Ref Range   Color, Urine YELLOW  YELLOW   APPearance CLEAR  CLEAR   Specific Gravity, Urine 1.025  1.005 - 1.030   pH 6.5  5.0 - 8.0   Glucose, UA NEGATIVE  NEGATIVE mg/dL   Hgb urine dipstick NEGATIVE  NEGATIVE   Bilirubin Urine NEGATIVE  NEGATIVE   Ketones, ur 15 (*) NEGATIVE mg/dL   Protein, ur NEGATIVE  NEGATIVE mg/dL   Urobilinogen, UA 0.2  0.0 - 1.0 mg/dL   Nitrite NEGATIVE  NEGATIVE   Leukocytes, UA NEGATIVE  NEGATIVE  WET PREP, GENITAL   Collection Time    07/09/13  2:45 AM      Result Value Ref Range   Yeast Wet Prep HPF POC NONE SEEN  NONE SEEN   Trich, Wet Prep NONE SEEN  NONE SEEN   Clue Cells Wet Prep HPF POC NONE SEEN  NONE SEEN   WBC, Wet Prep HPF POC MODERATE (*) NONE SEEN    Imaging Studies:  No results found.  Assessment: [name] is  23 y.o. G2P0010 at 6637w6d presents with Abdominal Pain and Vaginal Discharge  0255 wet prep, give GI cocktail   Plan: Discharge to home.   #Ab pain: doesn't appear to be reflux in nature. More MSK in description. No signs of ctx on monitoring and reassuring fetal monitoring.  -  tylenol PRN  - f/u as scheduled.   #FWB: cat 1.   [name] 6/9/20154:24 AM  I have seen and examined this patient and agree with above documentation in the resident's note. Pt with pain only with sharp and brisk movements and only at the fundus. Has been consistent for the last 3 weeks.  No change. Vaginal discharge normal today, some pink once last night after intercourse.  Reassurance given. FWB cat I tracing.    Kelli AbideKeli Shakeena Jimenez, M.D. [facility] 07/09/2013 5:47 AM

## 2013-07-10 NOTE — MAU Provider Note (Signed)
Attestation of Attending Supervision of Advanced Practitioner (CNM/NP): Evaluation and management procedures were performed by the Advanced Practitioner under my supervision and collaboration.  I have reviewed the Advanced Practitioner's note and chart, and I agree with the management and plan.  Orlando Devereux 07/10/2013 3:53 PM   

## 2013-07-17 ENCOUNTER — Encounter: Payer: Self-pay | Admitting: Obstetrics and Gynecology

## 2013-07-17 ENCOUNTER — Ambulatory Visit (INDEPENDENT_AMBULATORY_CARE_PROVIDER_SITE_OTHER): Payer: Medicaid Other | Admitting: Obstetrics and Gynecology

## 2013-07-17 VITALS — BP 102/70 | Temp 98.3°F | Wt 185.0 lb

## 2013-07-17 DIAGNOSIS — Z3490 Encounter for supervision of normal pregnancy, unspecified, unspecified trimester: Secondary | ICD-10-CM

## 2013-07-17 DIAGNOSIS — Z3480 Encounter for supervision of other normal pregnancy, unspecified trimester: Secondary | ICD-10-CM

## 2013-07-17 NOTE — Progress Notes (Signed)
Doing well. Note to work fewer hrs at [employer]. Plans reviewed: breast, IUD.

## 2013-07-17 NOTE — Patient Instructions (Signed)
Third Trimester of Pregnancy The third trimester is from week 29 through week 42, months 7 through 9. The third trimester is a time when the fetus is growing rapidly. At the end of the ninth month, the fetus is about 20 inches in length and weighs 6-10 pounds.  BODY CHANGES Your body goes through many changes during pregnancy. The changes vary from woman to woman.   Your weight will continue to increase. You can expect to gain 25-35 pounds (11-16 kg) by the end of the pregnancy.  You may begin to get stretch marks on your hips, abdomen, and breasts.  You may urinate more often because the fetus is moving lower into your pelvis and pressing on your bladder.  You may develop or continue to have heartburn as a result of your pregnancy.  You may develop constipation because certain hormones are causing the muscles that push waste through your intestines to slow down.  You may develop hemorrhoids or swollen, bulging veins (varicose veins).  You may have pelvic pain because of the weight gain and pregnancy hormones relaxing your joints between the bones in your pelvis. Backaches may result from overexertion of the muscles supporting your posture.  You may have changes in your hair. These can include thickening of your hair, rapid growth, and changes in texture. Some women also have hair loss during or after pregnancy, or hair that feels dry or thin. Your hair will most likely return to normal after your baby is born.  Your breasts will continue to grow and be tender. A yellow discharge may leak from your breasts called colostrum.  Your belly button may stick out.  You may feel short of breath because of your expanding uterus.  You may notice the fetus "dropping," or moving lower in your abdomen.  You may have a bloody mucus discharge. This usually occurs a few days to a week before labor begins.  Your cervix becomes thin and soft (effaced) near your due date. WHAT TO EXPECT AT YOUR PRENATAL  EXAMS  You will have prenatal exams every 2 weeks until week 36. Then, you will have weekly prenatal exams. During a routine prenatal visit:  You will be weighed to make sure you and the fetus are growing normally.  Your blood pressure is taken.  Your abdomen will be measured to track your baby's growth.  The fetal heartbeat will be listened to.  Any test results from the previous visit will be discussed.  You may have a cervical check near your due date to see if you have effaced. At around 36 weeks, your caregiver will check your cervix. At the same time, your caregiver will also perform a test on the secretions of the vaginal tissue. This test is to determine if a type of bacteria, Group B streptococcus, is present. Your caregiver will explain this further. Your caregiver may ask you:  What your birth plan is.  How you are feeling.  If you are feeling the baby move.  If you have had any abnormal symptoms, such as leaking fluid, bleeding, severe headaches, or abdominal cramping.  If you have any questions. Other tests or screenings that may be performed during your third trimester include:  Blood tests that check for low iron levels (anemia).  Fetal testing to check the health, activity level, and growth of the fetus. Testing is done if you have certain medical conditions or if there are problems during the pregnancy. FALSE LABOR You may feel small, irregular contractions that   eventually go away. These are called Braxton Hicks contractions, or false labor. Contractions may last for hours, days, or even weeks before true labor sets in. If contractions come at regular intervals, intensify, or become painful, it is best to be seen by your caregiver.  SIGNS OF LABOR   Menstrual-like cramps.  Contractions that are 5 minutes apart or less.  Contractions that start on the top of the uterus and spread down to the lower abdomen and back.  A sense of increased pelvic pressure or back  pain.  A watery or bloody mucus discharge that comes from the vagina. If you have any of these signs before the 37th week of pregnancy, call your caregiver right away. You need to go to the hospital to get checked immediately. HOME CARE INSTRUCTIONS   Avoid all smoking, herbs, alcohol, and unprescribed drugs. These chemicals affect the formation and growth of the baby.  Follow your caregiver's instructions regarding medicine use. There are medicines that are either safe or unsafe to take during pregnancy.  Exercise only as directed by your caregiver. Experiencing uterine cramps is a good sign to stop exercising.  Continue to eat regular, healthy meals.  Wear a good support bra for breast tenderness.  Do not use hot tubs, steam rooms, or saunas.  Wear your seat belt at all times when driving.  Avoid raw meat, uncooked cheese, cat litter boxes, and soil used by cats. These carry germs that can cause birth defects in the baby.  Take your prenatal vitamins.  Try taking a stool softener (if your caregiver approves) if you develop constipation. Eat more high-fiber foods, such as fresh vegetables or fruit and whole grains. Drink plenty of fluids to keep your urine clear or pale yellow.  Take warm sitz baths to soothe any pain or discomfort caused by hemorrhoids. Use hemorrhoid cream if your caregiver approves.  If you develop varicose veins, wear support hose. Elevate your feet for 15 minutes, 3-4 times a day. Limit salt in your diet.  Avoid heavy lifting, wear low heal shoes, and practice good posture.  Rest a lot with your legs elevated if you have leg cramps or low back pain.  Visit your dentist if you have not gone during your pregnancy. Use a soft toothbrush to brush your teeth and be gentle when you floss.  A sexual relationship may be continued unless your caregiver directs you otherwise.  Do not travel far distances unless it is absolutely necessary and only with the approval  of your caregiver.  Take prenatal classes to understand, practice, and ask questions about the labor and delivery.  Make a trial run to the hospital.  Pack your hospital bag.  Prepare the baby's nursery.  Continue to go to all your prenatal visits as directed by your caregiver. SEEK MEDICAL CARE IF:  You are unsure if you are in labor or if your water has broken.  You have dizziness.  You have mild pelvic cramps, pelvic pressure, or nagging pain in your abdominal area.  You have persistent nausea, vomiting, or diarrhea.  You have a bad smelling vaginal discharge.  You have pain with urination. SEEK IMMEDIATE MEDICAL CARE IF:   You have a fever.  You are leaking fluid from your vagina.  You have spotting or bleeding from your vagina.  You have severe abdominal cramping or pain.  You have rapid weight loss or gain.  You have shortness of breath with chest pain.  You notice sudden or extreme swelling   of your face, hands, ankles, feet, or legs.  You have not felt your baby move in over an hour.  You have severe headaches that do not go away with medicine.  You have vision changes. Document Released: 01/11/2001 Document Revised: 01/22/2013 Document Reviewed: 03/20/2012 [employer] Patient Information 2015 [employer]. This information is not intended to replace advice given to you by your health care provider. Make sure you discuss any questions you have with your health care provider.  

## 2013-07-19 ENCOUNTER — Encounter (HOSPITAL_COMMUNITY): Payer: Self-pay | Admitting: Emergency Medicine

## 2013-07-19 ENCOUNTER — Emergency Department (HOSPITAL_COMMUNITY)
Admission: EM | Admit: 2013-07-19 | Discharge: 2013-07-19 | Disposition: A | Discharge status: Home / Self Care | Payer: BC Managed Care – PPO | Attending: Emergency Medicine | Admitting: Emergency Medicine

## 2013-07-19 DIAGNOSIS — O2390 Unspecified genitourinary tract infection in pregnancy, unspecified trimester: Secondary | ICD-10-CM | POA: Insufficient documentation

## 2013-07-19 DIAGNOSIS — Z8619 Personal history of other infectious and parasitic diseases: Secondary | ICD-10-CM | POA: Insufficient documentation

## 2013-07-19 DIAGNOSIS — Z87891 Personal history of nicotine dependence: Secondary | ICD-10-CM | POA: Diagnosis not present

## 2013-07-19 DIAGNOSIS — N750 Cyst of Bartholin's gland: Secondary | ICD-10-CM | POA: Diagnosis not present

## 2013-07-19 HISTORY — DX: Other specified noninflammatory disorders of vagina: N89.8

## 2013-07-19 LAB — URINALYSIS, ROUTINE W REFLEX MICROSCOPIC
BILIRUBIN URINE: NEGATIVE
GLUCOSE, UA: NEGATIVE mg/dL
Hgb urine dipstick: NEGATIVE
Ketones, ur: NEGATIVE mg/dL
Nitrite: NEGATIVE
Protein, ur: NEGATIVE mg/dL
Specific Gravity, Urine: 1.019 (ref 1.005–1.030)
Urobilinogen, UA: 0.2 mg/dL (ref 0.0–1.0)
pH: 6 (ref 5.0–8.0)

## 2013-07-19 LAB — URINE MICROSCOPIC-ADD ON

## 2013-07-19 MED ORDER — ACETAMINOPHEN 500 MG PO TABS
1000.0000 mg | ORAL_TABLET | Freq: Once | ORAL | Status: AC
  Administered 2013-07-19: 1000 mg via ORAL
  Filled 2013-07-19: qty 2

## 2013-07-19 MED ORDER — LIDOCAINE HCL (PF) 1 % IJ SOLN
5.0000 mL | Freq: Once | INTRAMUSCULAR | Status: AC
  Administered 2013-07-19: 5 mL via INTRADERMAL
  Filled 2013-07-19: qty 5

## 2013-07-19 NOTE — ED Notes (Signed)
Patient is alert and oriented x3.  She is currently 8 months pregnant and is complaining of a reoccurring vaginal cyst.  She has a history of this issue.  Currently she rates her pain 9 of 10.

## 2013-07-19 NOTE — Discharge Instructions (Signed)
You may take Tylenol 1000 mg every 6 hours as needed for pain. This medication is found over-the-counter.  Bartholin's Cyst or Abscess Bartholin's glands are small glands located within the folds of skin (labia) along the sides of the lower opening of the vagina (birth canal). A cyst may develop when the duct of the gland becomes blocked. When this happens, fluid that accumulates within the cyst can become infected. This is known as an abscess. The Bartholin gland produces a mucous fluid to lubricate the outside of the vagina during sexual intercourse. SYMPTOMS   Patients with a small cyst may not have any symptoms.  Mild discomfort to severe pain depending on the size of the cyst and if it is infected (abscess).  Pain, redness, and swelling around the lower opening of the vagina.  Painful intercourse.  Pressure in the perineal area.  Swelling of the lips of the vagina (labia).  The cyst or abscess can be on one side or both sides of the vagina. DIAGNOSIS   A large swelling is seen in the lower vagina area by your caregiver.  Painful to touch.  Redness and pain, if it is an abscess. TREATMENT   Sometimes the cyst will go away on its own.  Apply warm wet compresses to the area or take hot sitz baths several times a day.  An incision to drain the cyst or abscess with local anesthesia.  Culture the pus, if it is an abscess.  Antibiotic treatment, if it is an abscess.  Cut open the gland and suture the edges to make the opening of the gland bigger (marsupialization).  Remove the whole gland if the cyst or abscess returns. PREVENTION   Practice good hygiene.  Clean the vaginal area with a mild soap and soft cloth when bathing.  Do not rub hard in the vaginal area when bathing.  Protect the crotch area with a padded cushion if you take long bike rides or ride horses.  Be sure you are well lubricated when you have sexual intercourse. HOME CARE INSTRUCTIONS   If your  cyst or abscess was opened, a small piece of gauze, or a drain, may have been placed in the wound to allow drainage. Do not remove this gauze or drain unless directed by your caregiver.  Wear feminine pads, not tampons, as needed for any drainage or bleeding.  If antibiotics were prescribed, take them exactly as directed. Finish the entire course.  Only take over-the-counter or prescription medicines for pain, discomfort, or fever as directed by your caregiver. SEEK IMMEDIATE MEDICAL CARE IF:   You have an increase in pain, redness, swelling, or drainage.  You have bleeding from the wound which results in the use of more than the number of pads suggested by your caregiver in 24 hours.  You have chills.  You have a fever.  You develop any new problems (symptoms) or aggravation of your existing condition. MAKE SURE YOU:   Understand these instructions.  Will watch your condition.  Will get help right away if you are not doing well or get worse. Document Released: 01/17/2005 Document Revised: 04/11/2011 Document Reviewed: 09/05/2007 [employer] Patient Information 2015 [place], [place]. This information is not intended to replace advice given to you by your health care provider. Make sure you discuss any questions you have with your health care provider.

## 2013-07-19 NOTE — ED Provider Notes (Signed)
TIME SEEN: 7:01 AM  CHIEF COMPLAINT: Bartholin's cyst  HPI: Patient is a 23 year old female with history of 2 prior right-sided Bartholin's cysts, genital warts who is currently 8 months pregnant due on August 12th who presents to the emergency department with a Bartholin's cyst on the left side that started yesterday. She denies any fevers or chills. No nausea, vomiting or diarrhea. No vaginal bleeding or discharge. No dysuria or hematuria. She states she's had good fetal activity and felt fetal movement. No contractions, leaking fluid. She is followed [facility].  She denies any complications with this pregnancy.  ROS: See HPI Constitutional: no fever  Eyes: no drainage  ENT: no runny nose   Cardiovascular:  no chest pain  Resp: no SOB  GI: no vomiting GU: no dysuria Integumentary: no rash  Allergy: no hives  Musculoskeletal: no leg swelling  Neurological: no slurred speech ROS otherwise negative  PAST MEDICAL HISTORY/PAST SURGICAL HISTORY:  Past Medical History  Diagnosis Date  . Ovarian cyst 2013  . Abscess     Bartholin's Cyst (right)x2 2013  . 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 Vaginal cyst     MEDICATIONS:  Prior to Admission medications   Medication Sig Start Date End Date Taking? Authorizing Provider  Prenatal Vit-Fe Fumarate-FA (MULTIVITAMIN-PRENATAL) 27-0.8 MG TABS tablet Take 1 tablet by mouth daily at 12 noon.    Historical Provider, MD  terconazole (TERAZOL 7) 0.4 % vaginal cream Place 1 applicator vaginally at bedtime. 06/05/13   Aviva SignsMarie L Williams, CNM    ALLERGIES:  No Known Allergies  SOCIAL HISTORY:  History  Substance Use Topics  . Smoking status: Former Smoker    Types: Pipe    Quit date: 01/31/2010  . Smokeless tobacco: Never Used  . Alcohol Use: No     Comment: occassional, light    FAMILY HISTORY: History reviewed. No pertinent family history.  EXAM: BP 123/82  Pulse 100  Temp(Src) 98 F (36.7 C) (Oral)   Resp 18  SpO2 100%  LMP 12/05/2012 CONSTITUTIONAL: Alert and oriented and responds appropriately to questions. Well-appearing; well-nourished HEAD: Normocephalic EYES: Conjunctivae clear, PERRL ENT: normal nose; no rhinorrhea; moist mucous membranes; pharynx without lesions noted NECK: Supple, no meningismus, no LAD  CARD: RRR; S1 and S2 appreciated; no murmurs, no clicks, no rubs, no gallops RESP: Normal chest excursion without splinting or tachypnea; breath sounds clear and equal bilaterally; no wheezes, no rhonchi, no rales,  ABD/GI: Normal bowel sounds; soft, non-tender, no rebound, no guarding; gravid uterus that is felt approximately 16cm above the level of the umbilicus GU:  Patient has a large left Bartholin cyst with no surrounding erythema or induration, no drainage, no other lesions BACK:  The back appears normal and is non-tender to palpation, there is no CVA tenderness EXT: Normal ROM in all joints; non-tender to palpation; no edema; normal capillary refill; no cyanosis    SKIN: Normal color for age and race; warm NEURO: Moves all extremities equally PSYCH: The patient's mood and manner are appropriate. Grooming and personal hygiene are appropriate.  MEDICAL DECISION MAKING: Patient here with Bartholin's cyst. No surrounding signs of cellulitis.  Cyst was I&D'ed and Word's catheter placed.  FHT in the 150s.  We'll give Tylenol for pain control. I do not feel antibiotics are indicated at this time. We'll have her followup with her OB/GYN for removal of the catheter. Discussed supportive care instructions and return precautions. Patient verbalizes understanding and is comfortable with  plan.  INCISION AND DRAINAGE Performed by: Raelyn NumberWARD, KRISTEN N Consent: Verbal consent obtained. Risks and benefits: risks, benefits and alternatives were discussed Type: abscess  Body area: left labia minora  Anesthesia: local infiltration  Incision was made with a scalpel.  Local anesthetic:  lidocaine 1% without epinephrine  Anesthetic total: 4 ml  Complexity: complex Blunt dissection to break up loculations  Drainage: purulent  Drainage amount: 20 mL  Packing material: Word's catheter  Patient tolerance: Patient tolerated the procedure well with no immediate complications.        Layla MawKristen N Ward, DO 07/19/13 [phone]

## 2013-07-19 NOTE — ED Notes (Signed)
MD at bedside. RAHEL NT ASSISTING MD WARD.

## 2013-07-20 LAB — URINE CULTURE
COLONY COUNT: NO GROWTH
CULTURE: NO GROWTH

## 2013-08-01 ENCOUNTER — Encounter: Payer: Self-pay | Admitting: Family Medicine

## 2013-08-01 ENCOUNTER — Ambulatory Visit (INDEPENDENT_AMBULATORY_CARE_PROVIDER_SITE_OTHER): Payer: BC Managed Care – PPO | Admitting: Family Medicine

## 2013-08-01 VITALS — BP 109/71 | HR 98 | Wt 186.5 lb

## 2013-08-01 DIAGNOSIS — Z3493 Encounter for supervision of normal pregnancy, unspecified, third trimester: Secondary | ICD-10-CM

## 2013-08-01 DIAGNOSIS — Z3480 Encounter for supervision of other normal pregnancy, unspecified trimester: Secondary | ICD-10-CM

## 2013-08-01 NOTE — Patient Instructions (Signed)
Third Trimester of Pregnancy The third trimester is from week 29 through week 42, months 7 through 9. The third trimester is a time when the fetus is growing rapidly. At the end of the ninth month, the fetus is about 20 inches in length and weighs 6-10 pounds.  BODY CHANGES Your body goes through many changes during pregnancy. The changes vary from woman to woman.   Your weight will continue to increase. You can expect to gain 25-35 pounds (11-16 kg) by the end of the pregnancy.  You may begin to get stretch marks on your hips, abdomen, and breasts.  You may urinate more often because the fetus is moving lower into your pelvis and pressing on your bladder.  You may develop or continue to have heartburn as a result of your pregnancy.  You may develop constipation because certain hormones are causing the muscles that push waste through your intestines to slow down.  You may develop hemorrhoids or swollen, bulging veins (varicose veins).  You may have pelvic pain because of the weight gain and pregnancy hormones relaxing your joints between the bones in your pelvis. Backaches may result from overexertion of the muscles supporting your posture.  You may have changes in your hair. These can include thickening of your hair, rapid growth, and changes in texture. Some women also have hair loss during or after pregnancy, or hair that feels dry or thin. Your hair will most likely return to normal after your baby is born.  Your breasts will continue to grow and be tender. A yellow discharge may leak from your breasts called colostrum.  Your belly button may stick out.  You may feel short of breath because of your expanding uterus.  You may notice the fetus "dropping," or moving lower in your abdomen.  You may have a bloody mucus discharge. This usually occurs a few days to a week before labor begins.  Your cervix becomes thin and soft (effaced) near your due date. WHAT TO EXPECT AT YOUR PRENATAL  EXAMS  You will have prenatal exams every 2 weeks until week 36. Then, you will have weekly prenatal exams. During a routine prenatal visit:  You will be weighed to make sure you and the fetus are growing normally.  Your blood pressure is taken.  Your abdomen will be measured to track your baby's growth.  The fetal heartbeat will be listened to.  Any test results from the previous visit will be discussed.  You may have a cervical check near your due date to see if you have effaced. At around 36 weeks, your caregiver will check your cervix. At the same time, your caregiver will also perform a test on the secretions of the vaginal tissue. This test is to determine if a type of bacteria, Group B streptococcus, is present. Your caregiver will explain this further. Your caregiver may ask you:  What your birth plan is.  How you are feeling.  If you are feeling the baby move.  If you have had any abnormal symptoms, such as leaking fluid, bleeding, severe headaches, or abdominal cramping.  If you have any questions. Other tests or screenings that may be performed during your third trimester include:  Blood tests that check for low iron levels (anemia).  Fetal testing to check the health, activity level, and growth of the fetus. Testing is done if you have certain medical conditions or if there are problems during the pregnancy. FALSE LABOR You may feel small, irregular contractions that   eventually go away. These are called Braxton Hicks contractions, or false labor. Contractions may last for hours, days, or even weeks before true labor sets in. If contractions come at regular intervals, intensify, or become painful, it is best to be seen by your caregiver.  SIGNS OF LABOR   Menstrual-like cramps.  Contractions that are 5 minutes apart or less.  Contractions that start on the top of the uterus and spread down to the lower abdomen and back.  A sense of increased pelvic pressure or back  pain.  A watery or bloody mucus discharge that comes from the vagina. If you have any of these signs before the 37th week of pregnancy, call your caregiver right away. You need to go to the hospital to get checked immediately. HOME CARE INSTRUCTIONS   Avoid all smoking, herbs, alcohol, and unprescribed drugs. These chemicals affect the formation and growth of the baby.  Follow your caregiver's instructions regarding medicine use. There are medicines that are either safe or unsafe to take during pregnancy.  Exercise only as directed by your caregiver. Experiencing uterine cramps is a good sign to stop exercising.  Continue to eat regular, healthy meals.  Wear a good support bra for breast tenderness.  Do not use hot tubs, steam rooms, or saunas.  Wear your seat belt at all times when driving.  Avoid raw meat, uncooked cheese, cat litter boxes, and soil used by cats. These carry germs that can cause birth defects in the baby.  Take your prenatal vitamins.  Try taking a stool softener (if your caregiver approves) if you develop constipation. Eat more high-fiber foods, such as fresh vegetables or fruit and whole grains. Drink plenty of fluids to keep your urine clear or pale yellow.  Take warm sitz baths to soothe any pain or discomfort caused by hemorrhoids. Use hemorrhoid cream if your caregiver approves.  If you develop varicose veins, wear support hose. Elevate your feet for 15 minutes, 3-4 times a day. Limit salt in your diet.  Avoid heavy lifting, wear low heal shoes, and practice good posture.  Rest a lot with your legs elevated if you have leg cramps or low back pain.  Visit your dentist if you have not gone during your pregnancy. Use a soft toothbrush to brush your teeth and be gentle when you floss.  A sexual relationship may be continued unless your caregiver directs you otherwise.  Do not travel far distances unless it is absolutely necessary and only with the approval  of your caregiver.  Take prenatal classes to understand, practice, and ask questions about the labor and delivery.  Make a trial run to the hospital.  Pack your hospital bag.  Prepare the baby's nursery.  Continue to go to all your prenatal visits as directed by your caregiver. SEEK MEDICAL CARE IF:  You are unsure if you are in labor or if your water has broken.  You have dizziness.  You have mild pelvic cramps, pelvic pressure, or nagging pain in your abdominal area.  You have persistent nausea, vomiting, or diarrhea.  You have a bad smelling vaginal discharge.  You have pain with urination. SEEK IMMEDIATE MEDICAL CARE IF:   You have a fever.  You are leaking fluid from your vagina.  You have spotting or bleeding from your vagina.  You have severe abdominal cramping or pain.  You have rapid weight loss or gain.  You have shortness of breath with chest pain.  You notice sudden or extreme swelling   of your face, hands, ankles, feet, or legs.  You have not felt your baby move in over an hour.  You have severe headaches that do not go away with medicine.  You have vision changes. Document Released: 01/11/2001 Document Revised: 01/22/2013 Document Reviewed: 03/20/2012 [employer] Patient Information 2015 [employer]. This information is not intended to replace advice given to you by your health care provider. Make sure you discuss any questions you have with your health care provider.  

## 2013-08-01 NOTE — Progress Notes (Signed)
+  FM, no lof, no vb, no ctx No complaints  Kelli Jimenez is a 23 y.o. G2P0010 at 5134w1d by L=6 here for ROB visit.  Discussed with Patient:  -Plans to breast feed.  All questions answered. -Continue prenatal vitamins. -Reviewed fetal kick counts Pt to perform daily at a time when the baby is active, lie laterally with both hands on belly in quiet room and count all movements (hiccups, shoulder rolls, obvious kicks, etc); pt is to report to clinic L&D for less than 10 movements felt in a one hour time period-pt told as soon as she counts 10 movements the count is complete.  - Routine precautions discussed (depression, infection s/s).   Patient provided with all pertinent phone numbers for emergencies. - RTC for any VB, regular, painful cramps/ctxs occurring at a rate of >2/10 min, fever (100.5 or higher), n/v/d, any pain that is unresolving or worsening, LOF, decreased fetal movement, CP, SOB, edema - RTC in 2 weeks for next appt.  Problems: Patient Active Problem List   Diagnosis Date Noted  . Supervision of normal pregnancy 02/12/2013    To Do:  [ ]  Vaccines:  Tdap: 06/19/13 [ x] BCM: Mirena [ x] Readiness: baby has a place to sleep, car seat, other baby necessities.  Edu: [x ] PTL precautions; [ ]  BF class; [ ]  childbirth class; [ ]   BF counseling;

## 2013-08-20 ENCOUNTER — Ambulatory Visit (INDEPENDENT_AMBULATORY_CARE_PROVIDER_SITE_OTHER): Payer: BC Managed Care – PPO | Admitting: Advanced Practice Midwife

## 2013-08-20 VITALS — BP 125/74 | HR 100 | Temp 97.6°F | Wt 195.8 lb

## 2013-08-20 DIAGNOSIS — Z3480 Encounter for supervision of other normal pregnancy, unspecified trimester: Secondary | ICD-10-CM

## 2013-08-20 DIAGNOSIS — Z3493 Encounter for supervision of normal pregnancy, unspecified, third trimester: Secondary | ICD-10-CM

## 2013-08-20 DIAGNOSIS — Z3483 Encounter for supervision of other normal pregnancy, third trimester: Secondary | ICD-10-CM

## 2013-08-20 LAB — POCT URINALYSIS DIP (DEVICE)
BILIRUBIN URINE: NEGATIVE
GLUCOSE, UA: NEGATIVE mg/dL
HGB URINE DIPSTICK: NEGATIVE
Ketones, ur: NEGATIVE mg/dL
Nitrite: NEGATIVE
Protein, ur: NEGATIVE mg/dL
SPECIFIC GRAVITY, URINE: 1.025 (ref 1.005–1.030)
Urobilinogen, UA: 0.2 mg/dL (ref 0.0–1.0)
pH: 7 (ref 5.0–8.0)

## 2013-08-20 LAB — OB RESULTS CONSOLE GC/CHLAMYDIA
Chlamydia: NEGATIVE
GC PROBE AMP, GENITAL: NEGATIVE

## 2013-08-20 LAB — OB RESULTS CONSOLE GBS: GBS: POSITIVE

## 2013-08-20 NOTE — Progress Notes (Signed)
Pain-hip/ pelvic

## 2013-08-21 LAB — GC/CHLAMYDIA PROBE AMP
CT Probe RNA: NEGATIVE
GC Probe RNA: NEGATIVE

## 2013-08-21 NOTE — Progress Notes (Signed)
Doing well.  Good fetal movement, denies vaginal bleeding, LOF, regular contractions.  Does report pain above hipbones on both sides, is constant.  Discussed physical changes of pregnancy, reviewed signs of labor.

## 2013-08-23 ENCOUNTER — Encounter: Payer: Self-pay | Admitting: Advanced Practice Midwife

## 2013-08-23 DIAGNOSIS — O99820 Streptococcus B carrier state complicating pregnancy: Secondary | ICD-10-CM | POA: Insufficient documentation

## 2013-08-23 LAB — CULTURE, BETA STREP (GROUP B ONLY)

## 2013-08-28 ENCOUNTER — Ambulatory Visit (INDEPENDENT_AMBULATORY_CARE_PROVIDER_SITE_OTHER): Payer: BC Managed Care – PPO | Admitting: Advanced Practice Midwife

## 2013-08-28 VITALS — BP 119/78 | HR 91 | Wt 197.4 lb

## 2013-08-28 DIAGNOSIS — Z3480 Encounter for supervision of other normal pregnancy, unspecified trimester: Secondary | ICD-10-CM

## 2013-08-28 DIAGNOSIS — Z3483 Encounter for supervision of other normal pregnancy, third trimester: Secondary | ICD-10-CM

## 2013-08-28 LAB — POCT URINALYSIS DIP (DEVICE)
BILIRUBIN URINE: NEGATIVE
Glucose, UA: NEGATIVE mg/dL
HGB URINE DIPSTICK: NEGATIVE
KETONES UR: NEGATIVE mg/dL
Leukocytes, UA: NEGATIVE
Nitrite: NEGATIVE
PH: 6 (ref 5.0–8.0)
Protein, ur: NEGATIVE mg/dL
Specific Gravity, Urine: 1.025 (ref 1.005–1.030)
Urobilinogen, UA: 0.2 mg/dL (ref 0.0–1.0)

## 2013-08-28 NOTE — Progress Notes (Signed)
Doing well.  Good fetal movement, denies vaginal bleeding, LOF, regular contractions.  Vertex by [place].  Reviewed signs of labor, reasons to come to hospital.

## 2013-09-04 ENCOUNTER — Ambulatory Visit (INDEPENDENT_AMBULATORY_CARE_PROVIDER_SITE_OTHER): Payer: Medicaid Other | Admitting: Obstetrics and Gynecology

## 2013-09-04 ENCOUNTER — Encounter: Payer: Self-pay | Admitting: Obstetrics and Gynecology

## 2013-09-04 VITALS — BP 113/73 | HR 101 | Wt 200.8 lb

## 2013-09-04 DIAGNOSIS — Z3483 Encounter for supervision of other normal pregnancy, third trimester: Secondary | ICD-10-CM

## 2013-09-04 DIAGNOSIS — Z3480 Encounter for supervision of other normal pregnancy, unspecified trimester: Secondary | ICD-10-CM

## 2013-09-04 LAB — POCT URINALYSIS DIP (DEVICE)
Bilirubin Urine: NEGATIVE
Glucose, UA: NEGATIVE mg/dL
Hgb urine dipstick: NEGATIVE
Ketones, ur: NEGATIVE mg/dL
Nitrite: NEGATIVE
Protein, ur: NEGATIVE mg/dL
Specific Gravity, Urine: 1.015 (ref 1.005–1.030)
Urobilinogen, UA: 0.2 mg/dL (ref 0.0–1.0)
pH: 5.5 (ref 5.0–8.0)

## 2013-09-04 NOTE — Patient Instructions (Signed)

## 2013-09-11 ENCOUNTER — Ambulatory Visit (INDEPENDENT_AMBULATORY_CARE_PROVIDER_SITE_OTHER): Payer: Medicaid Other | Admitting: Advanced Practice Midwife

## 2013-09-11 VITALS — BP 116/78 | HR 95 | Wt 202.0 lb

## 2013-09-11 DIAGNOSIS — Z3493 Encounter for supervision of normal pregnancy, unspecified, third trimester: Secondary | ICD-10-CM

## 2013-09-11 DIAGNOSIS — Z3480 Encounter for supervision of other normal pregnancy, unspecified trimester: Secondary | ICD-10-CM

## 2013-09-11 LAB — POCT URINALYSIS DIP (DEVICE)
Bilirubin Urine: NEGATIVE
GLUCOSE, UA: NEGATIVE mg/dL
Hgb urine dipstick: NEGATIVE
Ketones, ur: NEGATIVE mg/dL
Nitrite: NEGATIVE
PH: 6 (ref 5.0–8.0)
PROTEIN: NEGATIVE mg/dL
SPECIFIC GRAVITY, URINE: 1.02 (ref 1.005–1.030)
UROBILINOGEN UA: 0.2 mg/dL (ref 0.0–1.0)

## 2013-09-11 NOTE — Progress Notes (Signed)
NST Friday. Discussed post-dates management.

## 2013-09-16 ENCOUNTER — Encounter: Payer: Self-pay | Admitting: *Deleted

## 2013-09-16 ENCOUNTER — Ambulatory Visit (INDEPENDENT_AMBULATORY_CARE_PROVIDER_SITE_OTHER): Payer: Medicaid Other | Admitting: *Deleted

## 2013-09-16 VITALS — BP 108/63 | HR 96

## 2013-09-16 DIAGNOSIS — O480 Post-term pregnancy: Secondary | ICD-10-CM

## 2013-09-16 LAB — FETAL NONSTRESS TEST

## 2013-09-16 NOTE — Progress Notes (Signed)
IOL scheduled 09/20/13 @ 1930

## 2013-09-17 ENCOUNTER — Telehealth (HOSPITAL_COMMUNITY): Payer: Self-pay | Admitting: *Deleted

## 2013-09-17 NOTE — Telephone Encounter (Signed)
Preadmission screen  

## 2013-09-18 ENCOUNTER — Encounter (HOSPITAL_COMMUNITY): Payer: Self-pay | Admitting: *Deleted

## 2013-09-20 ENCOUNTER — Inpatient Hospital Stay (HOSPITAL_COMMUNITY)
Admission: RE | Admit: 2013-09-20 | Discharge: 2013-09-23 | DRG: 774 | Disposition: A | Discharge status: Home / Self Care | Payer: Medicaid Other | Source: Ambulatory Visit | Attending: Obstetrics & Gynecology | Admitting: Obstetrics & Gynecology

## 2013-09-20 ENCOUNTER — Encounter (HOSPITAL_COMMUNITY): Payer: Self-pay

## 2013-09-20 VITALS — BP 110/58 | HR 56 | Temp 97.5°F | Resp 17 | Ht 66.0 in | Wt 198.0 lb

## 2013-09-20 DIAGNOSIS — O99892 Other specified diseases and conditions complicating childbirth: Secondary | ICD-10-CM | POA: Diagnosis present

## 2013-09-20 DIAGNOSIS — O98519 Other viral diseases complicating pregnancy, unspecified trimester: Secondary | ICD-10-CM | POA: Diagnosis present

## 2013-09-20 DIAGNOSIS — O480 Post-term pregnancy: Secondary | ICD-10-CM | POA: Diagnosis present

## 2013-09-20 DIAGNOSIS — B977 Papillomavirus as the cause of diseases classified elsewhere: Secondary | ICD-10-CM | POA: Diagnosis present

## 2013-09-20 DIAGNOSIS — Z22330 Carrier of Group B streptococcus: Secondary | ICD-10-CM

## 2013-09-20 DIAGNOSIS — O9989 Other specified diseases and conditions complicating pregnancy, childbirth and the puerperium: Secondary | ICD-10-CM

## 2013-09-20 DIAGNOSIS — O09899 Supervision of other high risk pregnancies, unspecified trimester: Secondary | ICD-10-CM | POA: Diagnosis not present

## 2013-09-20 DIAGNOSIS — Z87891 Personal history of nicotine dependence: Secondary | ICD-10-CM

## 2013-09-20 DIAGNOSIS — Z3493 Encounter for supervision of normal pregnancy, unspecified, third trimester: Secondary | ICD-10-CM

## 2013-09-20 DIAGNOSIS — O99820 Streptococcus B carrier state complicating pregnancy: Secondary | ICD-10-CM

## 2013-09-20 LAB — CBC
HEMATOCRIT: 41.2 % (ref 36.0–46.0)
Hemoglobin: 13.5 g/dL (ref 12.0–15.0)
MCH: 26.4 pg (ref 26.0–34.0)
MCHC: 32.8 g/dL (ref 30.0–36.0)
MCV: 80.5 fL (ref 78.0–100.0)
Platelets: 186 10*3/uL (ref 150–400)
RBC: 5.12 MIL/uL — ABNORMAL HIGH (ref 3.87–5.11)
RDW: 21.7 % — AB (ref 11.5–15.5)
WBC: 7.2 10*3/uL (ref 4.0–10.5)

## 2013-09-20 MED ORDER — ACETAMINOPHEN 325 MG PO TABS: 650.0000 mg | ORAL_TABLET | ORAL | Status: DC | PRN

## 2013-09-20 MED ORDER — OXYTOCIN 40 UNITS IN LACTATED RINGERS INFUSION - SIMPLE MED: 62.5000 mL/h | INTRAVENOUS | Status: DC

## 2013-09-20 MED ORDER — CITRIC ACID-SODIUM CITRATE 334-500 MG/5ML PO SOLN
30.0000 mL | ORAL | Status: DC | PRN
Start: 2013-09-20 — End: 2013-09-22

## 2013-09-20 MED ORDER — TERBUTALINE SULFATE 1 MG/ML IJ SOLN: 0.2500 mg | Freq: Once | INTRAMUSCULAR | Status: AC | PRN

## 2013-09-20 MED ORDER — OXYTOCIN BOLUS FROM INFUSION: 500.0000 mL | INTRAVENOUS | Status: DC

## 2013-09-20 MED ORDER — LACTATED RINGERS IV SOLN: 500.0000 mL | INTRAVENOUS | Status: DC | PRN

## 2013-09-20 MED ORDER — OXYCODONE-ACETAMINOPHEN 5-325 MG PO TABS: 1.0000 | ORAL_TABLET | ORAL | Status: DC | PRN

## 2013-09-20 MED ORDER — ONDANSETRON HCL 4 MG/2ML IJ SOLN
4.0000 mg | Freq: Four times a day (QID) | INTRAMUSCULAR | Status: DC | PRN
  Filled 2013-09-20: qty 2

## 2013-09-20 MED ORDER — LIDOCAINE HCL (PF) 1 % IJ SOLN
30.0000 mL | INTRAMUSCULAR | Status: DC | PRN
  Filled 2013-09-20: qty 30

## 2013-09-20 MED ORDER — LACTATED RINGERS IV SOLN
INTRAVENOUS | Status: DC
  Administered 2013-09-20 – 2013-09-22 (×4): via INTRAVENOUS

## 2013-09-20 MED ORDER — IBUPROFEN 600 MG PO TABS: 600.0000 mg | ORAL_TABLET | Freq: Four times a day (QID) | ORAL | Status: DC | PRN

## 2013-09-20 MED ORDER — MISOPROSTOL 200 MCG PO TABS
50.0000 ug | ORAL_TABLET | ORAL | Status: DC | PRN
  Administered 2013-09-20 – 2013-09-21 (×2): 50 ug via ORAL
  Filled 2013-09-20 (×2): qty 0.5

## 2013-09-20 NOTE — H&P (Signed)
Kelli Jimenez is a 23 y.o. female G2P0010 with IUP at [redacted]w[redacted]d presenting for IOL due to PD. She denies CTX, LOF or VB and notes good FM. She denies any complications this pregnancy, and no meds other than PNV.   [facility]ks  Prenatal History/Complications: none  Past Medical History: Past Medical History  Diagnosis Date  . Ovarian cyst 2013  . Abscess     Bartholin's Cyst (right)x2 2013  . 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 Vaginal cyst     Past Surgical History: Past Surgical History  Procedure Laterality Date  . Therapeutic abortion      Obstetrical History: OB History   Grav Para Term Preterm Abortions TAB SAB Ect Mult Living   2    1 1          Social History: History   Social History  . Marital Status: Single    Spouse Name: N/A    Number of Children: N/A  . Years of Education: N/A   Social History Main Topics  . Smoking status: Former Smoker    Types: Pipe    Quit date: 01/31/2010  . Smokeless tobacco: Never Used  . Alcohol Use: No     Comment: occassional, light  . Drug Use: No     Comment: pt states quit with pregnancy  . Sexual Activity: Yes   Other Topics Concern  . None   Social History Narrative  . None    Family History: Family History  Problem Relation Age of Onset  . Alcohol abuse Neg Hx   . Arthritis Neg Hx   . Asthma Neg Hx   . Birth defects Neg Hx   . Cancer Neg Hx   . COPD Neg Hx   . Depression Neg Hx   . Diabetes Neg Hx   . Drug abuse Neg Hx   . Early death Neg Hx   . Hearing loss Neg Hx   . Heart disease Neg Hx   . Hyperlipidemia Neg Hx   . Hypertension Neg Hx   . Kidney disease Neg Hx   . Learning disabilities Neg Hx   . Mental illness Neg Hx   . Mental retardation Neg Hx   . Miscarriages / Stillbirths Neg Hx   . Stroke Neg Hx   . Vision loss Neg Hx   . Varicose Veins Neg Hx     Allergies: No Known Allergies  Prescriptions prior to admission  Medication Sig Dispense Refill  .  Prenatal Vit-Fe Fumarate-FA (MULTIVITAMIN-PRENATAL) 27-0.8 MG TABS tablet Take 1 tablet by mouth daily.         Review of Systems: Negative unless otherwise stated in History above  Physicial Blood pressure 124/75, pulse 93, temperature 98.3 F (36.8 C), temperature source Oral, resp. rate 16, height 5\' 6"  (1.676 m), weight 89.812 kg (198 lb), last menstrual period 12/05/2012. General appearance: alert, cooperative and appears stated age Lungs: clear to auscultation bilaterally Heart: regular rate and rhythm Abdomen: soft, non-tender; bowel sounds normal Extremities: Homans sign is negative, no sign of DVT Presentation: cephalic Fetal monitoringBaseline: 145 bpm, Variability: Good {> 6 bpm), Accelerations: Reactive and Decelerations: Absent Uterine activityFrequency: Every 3-5 minutes Dilation: 1.5 Effacement (%): 30 Station: -2 Exam by:: Dr Gayla Doss   Prenatal labs: ABO, Rh: A/POS/-- (12/17 1148) Antibody: NEG (12/17 1148) Rubella:   RPR: NON REAC (05/20 1243)  HBsAg: NEGATIVE (12/17 1148)  HIV: NONREACTIVE (05/20 1243)  GBS: Positive (07/21 0000)  GTT: normal  Prenatal Transfer Tool  Maternal Diabetes: No Genetic Screening: Normal Maternal Ultrasounds/Referrals: Normal Fetal Ultrasounds or other Referrals:  None Maternal Substance Abuse:  Yes:  Type: Marijuana Significant Maternal Medications:  None Significant Maternal Lab Results: Lab values include: Group B Strep positive  No results found for this or any previous visit (from the past 24 hour(s)).  Assessment: Kelli Jimenez is a 23 y.o. G2P0010 at 1047w2d by here for IOL due to PD. Hx of THC use; No other complications.   #Labor: admit for IOL; unsuccessful FB attempt. Ctyotec PO given #Pain: Labor support; Epidural on request #FWB: Cat 1 #ID: GBS +; Will start PCN #Feeding: Breast #MOC: Mirena #Circ:  NA - Female  Wenda LowJames Joyner MD [facility] PGY[facility]2 09/20/2013, 9:30 PM  I have seen and examined this patient  and I agree with the above. Cam HaiSHAW, KIMBERLY CNM 10:07 PM 09/20/2013

## 2013-09-21 LAB — RPR

## 2013-09-21 MED ORDER — LACTATED RINGERS IV SOLN: 500.0000 mL | Freq: Once | INTRAVENOUS | Status: DC

## 2013-09-21 MED ORDER — OXYTOCIN 40 UNITS IN LACTATED RINGERS INFUSION - SIMPLE MED
1.0000 m[IU]/min | INTRAVENOUS | Status: DC
  Administered 2013-09-21: 2 m[IU]/min via INTRAVENOUS
  Filled 2013-09-21: qty 1000

## 2013-09-21 MED ORDER — PHENYLEPHRINE 40 MCG/ML (10ML) SYRINGE FOR IV PUSH (FOR BLOOD PRESSURE SUPPORT)
80.0000 ug | PREFILLED_SYRINGE | INTRAVENOUS | Status: DC | PRN
  Filled 2013-09-21: qty 2
  Filled 2013-09-21: qty 10

## 2013-09-21 MED ORDER — DIPHENHYDRAMINE HCL 50 MG/ML IJ SOLN: 12.5000 mg | INTRAMUSCULAR | Status: DC | PRN

## 2013-09-21 MED ORDER — EPHEDRINE 5 MG/ML INJ
10.0000 mg | INTRAVENOUS | Status: DC | PRN
  Filled 2013-09-21: qty 2

## 2013-09-21 MED ORDER — FENTANYL 2.5 MCG/ML BUPIVACAINE 1/10 % EPIDURAL INFUSION (WH - ANES)
14.0000 mL/h | INTRAMUSCULAR | Status: DC | PRN
  Administered 2013-09-22: 14 mL/h via EPIDURAL
  Filled 2013-09-21: qty 125

## 2013-09-21 MED ORDER — TERBUTALINE SULFATE 1 MG/ML IJ SOLN: 0.2500 mg | Freq: Once | INTRAMUSCULAR | Status: AC | PRN

## 2013-09-21 MED ORDER — NALBUPHINE HCL 10 MG/ML IJ SOLN: 10.0000 mg | Freq: Once | INTRAMUSCULAR | Status: DC

## 2013-09-21 MED ORDER — PENICILLIN G POTASSIUM 5000000 UNITS IJ SOLR
2.5000 10*6.[IU] | INTRAVENOUS | Status: DC
  Administered 2013-09-21 – 2013-09-22 (×4): 2.5 10*6.[IU] via INTRAVENOUS
  Filled 2013-09-21 (×7): qty 2.5

## 2013-09-21 MED ORDER — PHENYLEPHRINE 40 MCG/ML (10ML) SYRINGE FOR IV PUSH (FOR BLOOD PRESSURE SUPPORT)
80.0000 ug | PREFILLED_SYRINGE | INTRAVENOUS | Status: DC | PRN
  Filled 2013-09-21: qty 2

## 2013-09-21 MED ORDER — FENTANYL CITRATE 0.05 MG/ML IJ SOLN
100.0000 ug | INTRAMUSCULAR | Status: DC | PRN
  Administered 2013-09-21: 100 ug via INTRAVENOUS
  Filled 2013-09-21: qty 2

## 2013-09-21 MED ORDER — PENICILLIN G POTASSIUM 5000000 UNITS IJ SOLR
5.0000 10*6.[IU] | Freq: Once | INTRAVENOUS | Status: AC
  Administered 2013-09-21: 5 10*6.[IU] via INTRAVENOUS
  Filled 2013-09-21: qty 5

## 2013-09-21 NOTE — Progress Notes (Signed)
Patient ID: Kelli GrandchildYasmine C [facility], female   DOB: 06/19/1990, 23 y.o.   MRN: [id number] NST 09-16-13 reviewed and reactive

## 2013-09-21 NOTE — Progress Notes (Signed)
   [name] is a 23 y.o. G2P0010 at 2550w3d  admitted for induction of labor due to Post dates.   Subjective: Patient with some painful contractions q 5minutes. Currently not needing anything for pain, but will ask if needed   Objective: Filed Vitals:   09/21/13 1448 09/21/13 1500 09/21/13 1546 09/21/13 1854  BP:   109/74 106/69  Pulse:   76 92  Temp:   97.4 F (36.3 C)   TempSrc:   Oral   Resp: 20 20 20 18   Height:      Weight:          FHT:  FHR: 145 bpm, variability: moderate,  accelerations:  Present,  decelerations:  Absent UC:   irregular, every 5-8 minutes SVE:   Dilation: 4 Effacement (%): 80 Station: -2 Exam by:: Auriel RN    Labs: Lab Results  Component Value Date   WBC 7.2 09/20/2013   HGB 13.5 09/20/2013   HCT 41.2 09/20/2013   MCV 80.5 09/20/2013   PLT 186 09/20/2013    Assessment / Plan: Induction of labor due to postterm,  progressing well on pitocin  Labor: Progressing normally, will start pitocin Fetal Wellbeing:  Category I Pain Control:  Labor support without medications May use fentanyl later  Anticipated MOD:  NSVD  Joanna PuffDorsey, Crystal S 09/21/2013, 7:40 PM

## 2013-09-21 NOTE — Progress Notes (Signed)
LABOR PROGRESS NOTE  Kelli Jimenez is a 23 y.o. G2P0010 at 3660w3d  admitted for IOL 2/2  Prolonged pregnancy  Subjective: Comfortable, not feeling contractions  [phone] Objective: BP 124/80  Pulse 68  Temp(Src) 98.5 F (36.9 C) (Oral)  Resp 18  Ht 5\' 6"  (1.676 m)  Wt 198 lb (89.812 kg)  BMI 31.97 kg/m2  LMP 12/05/2012 or  Filed Vitals:   09/21/13 0921 09/21/13 0930 09/21/13 1001 09/21/13 1004  BP: 116/80   124/80  Pulse: 60   68  Temp:    98.5 F (36.9 C)  TempSrc:    Oral  Resp: 18 20 18 18   Height:      Weight:           FHT:  FHR: 145 bpm, variability: moderate,  accelerations:  Present,  decelerations:  Absent UC:   n SVE:   Dilation: 2 Effacement (%): 40 Station: -3 Exam by:: dr Loreta Aveacosta  Dilation: 2 Effacement (%): 40 Cervical Position: Posterior Station: -3 Presentation: Vertex Exam by:: dr Loreta Aveacosta  Foley placed for induction without complication  Labs: Lab Results  Component Value Date   WBC 7.2 09/20/2013   HGB 13.5 09/20/2013   HCT 41.2 09/20/2013   MCV 80.5 09/20/2013   PLT 186 09/20/2013    Assessment / Plan: Induction of labor due to postterm,  progressing well on pitocin  Labor: foley placed, continue cytotec Fetal Wellbeing:  Category I Pain Control:  Labor support without medications Anticipated MOD:  NSVD  Kelli Petrey ROCIO, MD 09/21/2013, 12:01 PM

## 2013-09-21 NOTE — Progress Notes (Signed)
   [name] is a 23 y.o. G2P0010 at 2864w3d  admitted for induction of labor due to Post dates.   Subjective: Ctx getting stronger; requesting pain meds  Objective: Filed Vitals:   09/21/13 2101 09/21/13 2130 09/21/13 2205 09/21/13 2230  BP: 106/57 114/73 118/74 121/84  Pulse: 70 82 67 69  Temp:   98.6 F (37 C)   TempSrc:   Oral   Resp: 18 16 16 18   Height:      Weight:          FHT:  FHR: 140 bpm, variability: moderate,  accelerations:  Present,  decelerations:  Absent UC:   irregular, every 5-8 minutes SVE:   Dilation: 5 Effacement (%): 80 Station: -1 Exam by:: Dr Gayla DossJoyner    Labs: Lab Results  Component Value Date   WBC 7.2 09/20/2013   HGB 13.5 09/20/2013   HCT 41.2 09/20/2013   MCV 80.5 09/20/2013   PLT 186 09/20/2013    Assessment / Plan: Induction of labor due to postterm,  progressing well on pitocin  Labor: Progressing normally on pitocin @ 8 Fetal Wellbeing:  Category I Pain Control:  Nubain given x 1 May use fentanyl later  Anticipated MOD:  NSVD  Wenda LowJoyner, Arwa Yero 09/21/2013, 10:53 PM

## 2013-09-21 NOTE — Progress Notes (Signed)
Foley bulb out--provider aware--orders to allow pt to shower and eat dinner--at 1900 orders given to start pitocin

## 2013-09-21 NOTE — Progress Notes (Signed)
Shanda BumpsJessica, RN took over care of pt

## 2013-09-22 ENCOUNTER — Inpatient Hospital Stay (HOSPITAL_COMMUNITY): Payer: Medicaid Other | Admitting: Anesthesiology

## 2013-09-22 ENCOUNTER — Encounter (HOSPITAL_COMMUNITY): Payer: Medicaid Other | Admitting: Anesthesiology

## 2013-09-22 ENCOUNTER — Encounter (HOSPITAL_COMMUNITY): Payer: Self-pay

## 2013-09-22 DIAGNOSIS — O09899 Supervision of other high risk pregnancies, unspecified trimester: Secondary | ICD-10-CM

## 2013-09-22 MED ORDER — PRENATAL MULTIVITAMIN CH
1.0000 | ORAL_TABLET | Freq: Every day | ORAL | Status: DC
  Administered 2013-09-22 – 2013-09-23 (×2): 1 via ORAL
  Filled 2013-09-22 (×2): qty 1

## 2013-09-22 MED ORDER — ONDANSETRON HCL 4 MG/2ML IJ SOLN: 4.0000 mg | INTRAMUSCULAR | Status: DC | PRN

## 2013-09-22 MED ORDER — DIBUCAINE 1 % RE OINT: 1.0000 "application " | TOPICAL_OINTMENT | RECTAL | Status: DC | PRN

## 2013-09-22 MED ORDER — FENTANYL 2.5 MCG/ML BUPIVACAINE 1/10 % EPIDURAL INFUSION (WH - ANES): 14.0000 mL/h | INTRAMUSCULAR | Status: DC | PRN

## 2013-09-22 MED ORDER — WITCH HAZEL-GLYCERIN EX PADS: 1.0000 "application " | MEDICATED_PAD | CUTANEOUS | Status: DC | PRN

## 2013-09-22 MED ORDER — BENZOCAINE-MENTHOL 20-0.5 % EX AERO: 1.0000 "application " | INHALATION_SPRAY | CUTANEOUS | Status: DC | PRN

## 2013-09-22 MED ORDER — IBUPROFEN 600 MG PO TABS
600.0000 mg | ORAL_TABLET | Freq: Four times a day (QID) | ORAL | Status: DC
  Administered 2013-09-22 – 2013-09-23 (×5): 600 mg via ORAL
  Filled 2013-09-22 (×5): qty 1

## 2013-09-22 MED ORDER — LIDOCAINE HCL (PF) 1 % IJ SOLN
INTRAMUSCULAR | Status: DC | PRN
  Administered 2013-09-22 (×2): 4 mL

## 2013-09-22 MED ORDER — ZOLPIDEM TARTRATE 5 MG PO TABS: 5.0000 mg | ORAL_TABLET | Freq: Every evening | ORAL | Status: DC | PRN

## 2013-09-22 MED ORDER — DIPHENHYDRAMINE HCL 25 MG PO CAPS: 25.0000 mg | ORAL_CAPSULE | Freq: Four times a day (QID) | ORAL | Status: DC | PRN

## 2013-09-22 MED ORDER — OXYCODONE-ACETAMINOPHEN 5-325 MG PO TABS
1.0000 | ORAL_TABLET | ORAL | Status: DC | PRN
  Filled 2013-09-22: qty 2

## 2013-09-22 MED ORDER — SENNOSIDES-DOCUSATE SODIUM 8.6-50 MG PO TABS
2.0000 | ORAL_TABLET | ORAL | Status: DC
  Administered 2013-09-22: 2 via ORAL
  Filled 2013-09-22: qty 2

## 2013-09-22 MED ORDER — LANOLIN HYDROUS EX OINT
TOPICAL_OINTMENT | CUTANEOUS | Status: DC | PRN
Start: 2013-09-22 — End: 2013-09-23

## 2013-09-22 MED ORDER — SIMETHICONE 80 MG PO CHEW: 80.0000 mg | CHEWABLE_TABLET | ORAL | Status: DC | PRN

## 2013-09-22 MED ORDER — MISOPROSTOL 200 MCG PO TABS
ORAL_TABLET | ORAL | Status: AC
  Administered 2013-09-22: 1000 ug
  Filled 2013-09-22: qty 5

## 2013-09-22 MED ORDER — TETANUS-DIPHTH-ACELL PERTUSSIS 5-2.5-18.5 LF-MCG/0.5 IM SUSP
0.5000 mL | Freq: Once | INTRAMUSCULAR | Status: DC
Start: 2013-09-23 — End: 2013-09-23

## 2013-09-22 MED ORDER — FENTANYL 2.5 MCG/ML BUPIVACAINE 1/10 % EPIDURAL INFUSION (WH - ANES)
INTRAMUSCULAR | Status: DC | PRN
  Administered 2013-09-22: 14 mL/h via EPIDURAL

## 2013-09-22 MED ORDER — ONDANSETRON HCL 4 MG PO TABS: 4.0000 mg | ORAL_TABLET | ORAL | Status: DC | PRN

## 2013-09-22 NOTE — Anesthesia Preprocedure Evaluation (Signed)
Anesthesia Evaluation  Patient identified by MRN, date of birth, ID band Patient awake    Reviewed: Allergy & Precautions, H&P , NPO status , Patient's Chart, lab work & pertinent test results  Airway Mallampati: II  TM Distance: >3 FB Neck ROM: Full    Dental no notable dental hx.    Pulmonary former smoker,  breath sounds clear to auscultation  Pulmonary exam normal       Cardiovascular negative cardio ROS  Rhythm:Regular Rate:Normal     Neuro/Psych negative neurological ROS  negative psych ROS   GI/Hepatic negative GI ROS, Neg liver ROS,   Endo/Other  negative endocrine ROS  Renal/GU negative Renal ROS     Musculoskeletal negative musculoskeletal ROS (+)   Abdominal   Peds  Hematology negative hematology ROS (+)   Anesthesia Other Findings   Reproductive/Obstetrics (+) Pregnancy                             Anesthesia Physical Anesthesia Plan  ASA: II  Anesthesia Plan: Epidural   Post-op Pain Management:    Induction:   Airway Management Planned:   Additional Equipment:   Intra-op Plan:   Post-operative Plan:   Informed Consent: I have reviewed the patients History and Physical, chart, labs and discussed the procedure including the risks, benefits and alternatives for the proposed anesthesia with the patient or authorized representative who has indicated his/her understanding and acceptance.     Plan Discussed with:   Anesthesia Plan Comments:         Anesthesia Quick Evaluation  

## 2013-09-22 NOTE — Anesthesia Procedure Notes (Signed)
Epidural Patient location during procedure: OB  Staffing Anesthesiologist: Raiana Pharris R Performed by: anesthesiologist   Preanesthetic Checklist Completed: patient identified, pre-op evaluation, timeout performed, IV checked, risks and benefits discussed and monitors and equipment checked  Epidural Patient position: sitting Prep: site prepped and draped and DuraPrep Patient monitoring: heart rate Approach: midline Location: L3-L4 Injection technique: LOR air and LOR saline  Needle:  Needle type: Tuohy  Needle gauge: 17 G Needle length: 9 cm Needle insertion depth: 7 cm Catheter type: closed end flexible Catheter size: 19 Gauge Catheter at skin depth: 13 cm Test dose: negative  Assessment Sensory level: T8 Events: blood not aspirated, injection not painful, no injection resistance, negative IV test and no paresthesia  Additional Notes Reason for block:procedure for pain   

## 2013-09-22 NOTE — Anesthesia Postprocedure Evaluation (Signed)
Anesthesia Post Note  Patient: Kelli Jimenez  Procedure(s) Performed: * No procedures listed *  Anesthesia type: Epidural  Patient location: Mother/Baby  Post pain: Pain level controlled  Post assessment: Post-op Vital signs reviewed  Last Vitals:  Filed Vitals:   09/22/13 0730  BP: 102/63  Pulse: 63  Temp: 36.8 C  Resp: 18    Post vital signs: Reviewed  Level of consciousness:alert  Complications: No apparent anesthesia complications

## 2013-09-22 NOTE — Progress Notes (Signed)
Kelli Jimenez is a 23 y.o. G2P0010 at [redacted]w[redacted]d.  Subjective: Comfortable w/ epidural.  Objective: BP 100/63  Pulse 92  Temp(Src) 98.7 F (37.1 C) (Oral)  Resp 18  Ht  (1.676 m)  Wt 89.812 kg (198 lb)  BMI 31.97 kg/m2  SpO2 98%  LMP 12/05/2012      FHT:  FHR: 130 bpm, variability: moderate,  accelerations:  Present,  decelerations:  Present earlies and few variables UC:   regular, every 2-4 minutes SVE:   Dilation: 10 Effacement (%): 100 Station: +1;0 Exam by:: Auriel RN   Labs: Lab Results  Component Value Date   WBC 7.2 09/20/2013   HGB 13.5 09/20/2013   HCT 41.2 09/20/2013   MCV 80.5 09/20/2013   PLT 186 09/20/2013    Assessment / Plan: Induction of labor due to postterm,  progressing well on pitocin  Labor: Progressing on Pitocin Preeclampsia:  NA Fetal Wellbeing:  Category I and Category II Pain Control:  Epidural I/D:  n/a Anticipated MOD:  NSVD  Kelli Jimenez 09/22/2013, 4:37 AM

## 2013-09-22 NOTE — Anesthesia Postprocedure Evaluation (Signed)
Anesthesia Post Note  Patient: Kelli Jimenez  Procedure(s) Performed: * No procedures listed *  Anesthesia type: Epidural  Patient location: Mother/Baby  Post pain: Pain level controlled  Post assessment: Post-op Vital signs reviewed  Last Vitals:  Filed Vitals:   09/22/13 0730  BP: 102/63  Pulse: 63  Temp: 36.8 C  Resp: 18    Post vital signs: Reviewed  Level of consciousness:alert  Complications: No apparent anesthesia complications  

## 2013-09-23 LAB — CBC
HCT: 36.3 % (ref 36.0–46.0)
Hemoglobin: 11.8 g/dL — ABNORMAL LOW (ref 12.0–15.0)
MCH: 26.2 pg (ref 26.0–34.0)
MCHC: 32.5 g/dL (ref 30.0–36.0)
MCV: 80.7 fL (ref 78.0–100.0)
Platelets: 146 10*3/uL — ABNORMAL LOW (ref 150–400)
RBC: 4.5 MIL/uL (ref 3.87–5.11)
RDW: 21.7 % — AB (ref 11.5–15.5)
WBC: 10.4 10*3/uL (ref 4.0–10.5)

## 2013-09-23 MED ORDER — NORETHINDRONE 0.35 MG PO TABS: 1.0000 | ORAL_TABLET | Freq: Every day | ORAL | Status: AC

## 2013-09-23 MED ORDER — IBUPROFEN 600 MG PO TABS: 600.0000 mg | ORAL_TABLET | Freq: Four times a day (QID) | ORAL | Status: AC

## 2013-09-23 NOTE — Discharge Summary (Signed)
Obstetric Discharge Summary Reason for Admission: induction of labor Prenatal Procedures: none Intrapartum Procedures: spontaneous vaginal delivery, antibiotics Postpartum Procedures: none Complications-Operative and Postpartum: concern for postpartum hemorrhage, received cytotec  Delivery Note At 5:29 AM a viable female was delivered via Vaginal, Spontaneous Delivery (Presentation: Right Occiput Anterior).  APGAR: 7, 9; weight 7 lb 6.3 oz (3355 g).   Placenta status: Intact, Spontaneous.  Cord: 3 vessels with the following complications: None.  Anesthesia: Epidural  Episiotomy: None Lacerations: 1st degree Suture Repair: 3.0 vicryl rapide Est. Blood Loss (mL): 350  Mom to postpartum.  Baby to Couplet care / Skin to Skin.  Heiley Shaikh ROCIO 09/23/2013, 1:57 PM     Hospital Course:  Active Problems:   Normal labor   Today: No acute events overnight.  Pt denies problems with ambulating, voiding or po intake.  She denies nausea or vomiting.  Pain is well controlled.  She has had flatus. She has had bowel movement.  Lochia Small.  Plan for birth control is  undecided long term, would like POPs for now.  Method of Feeding: breast  IKESHA SILLER is a 23 y.o. G2P1011 s/p SVD.  She has postoperative course that was uncomplicated including no problems with ambulating, PO intake, urination, pain, or bleeding. The pt feels ready to go home and  will be discharged with outpatient follow-up.    H/H: Lab Results  Component Value Date/Time   HGB 11.8* 09/23/2013  5:55 AM   HCT 36.3 09/23/2013  5:55 AM    Discharge Diagnoses: Term Pregnancy-delivered  Discharge Information: Date: 09/23/2013  Activity: pelvic rest Diet: routine  Medications: Ibuprofen and minipill Breast feeding:  Yes Condition: stable Instructions: refer to handout Discharge to: home      Medication List         ibuprofen 600 MG tablet  Commonly known as:  ADVIL,MOTRIN  Take 1 tablet (600 mg total)  by mouth every 6 (six) hours.     multivitamin-prenatal 27-0.8 MG Tabs tablet  Take 1 tablet by mouth daily.     norethindrone 0.35 MG tablet  Commonly known as:  MICRONOR,CAMILA,ERRIN  Take 1 tablet (0.35 mg total) by mouth daily.         Perry Mount ,MD OB Fellow 09/23/2013,1:57 PM

## 2013-09-23 NOTE — Discharge Summary (Signed)
Attestation of Attending Supervision of Advanced Practitioner (CNM/NP): Evaluation and management procedures were performed by the Advanced Practitioner under my supervision and collaboration.  I have reviewed the Advanced Practitioner's note and chart, and I agree with the management and plan.  Onalee Steinbach 09/23/2013 3:34 PM

## 2013-09-23 NOTE — Progress Notes (Signed)
UR chart review completed.  

## 2013-09-23 NOTE — Progress Notes (Signed)
Clinical Social Work Department PSYCHOSOCIAL ASSESSMENT - MATERNAL/CHILD 09/23/2013  Patient:  Kelli Jimenez, Kelli Jimenez  Account Number:  [id number]  Admit Date:  09/20/2013  Ardine Eng Name:   Pollie Friar    Clinical Social Worker:  Lucita Ferrara, CLINICAL SOCIAL WORKER   Date/Time:  09/23/2013 11:15 AM  Date Referred:  09/22/2013   Referral source  Central Nursery     Referred reason  Substance Abuse   Other referral source:    I:  FAMILY / [place] legal guardian:  PARENT  Guardian - Name Guardian - Age Guardian - Address  Nakyla Bracco [address] [place], [place] [address]  [place]  Same as above   Other household support members/support persons Other support:   MOB and FOB shared that they have numerous family members who live nearby and are supportive.    II  PSYCHOSOCIAL DATA Information Source:  Family Interview  [employer] Employment:   FOB is fully employed.  MOB shared that she is currently unemployed, but is able to return to previous place of employment when medically able.   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  She stated that she stopped smoking THC once she learned that she was pregnant.    V  SOCIAL WORK ASSESSMENT CSW met with MOB in her room in order to complete the  assessment. Consult ordered due to MOB history of THC during pregnancy.  FOB and MOB receptive to completing the assessment, but MOB presented as guarded while discussing substance as evidenced by providing short answers, but was overall receptive to exploring other psychosocial stressors.  FOB was attentive to the baby and asked questions related to how to best support the MOB during the post-partum period.  MOB's mood and affect appropriate to setting, and does acknowledge reason for CSW consult.   MOB and FOB expressed excitement as they become first time parents, and were receptive to exploring how they may experience an increase in anxiety as they make role adjustment to becoming first time parents and they continue to adjust to living together, as they moved in together during the pregnancy.  MOB and FOB receptive to education related to postpartum depression, and FOB inquired about how to best support MOB if she experiences symptoms.  CSW encouraged open dialogue between MOB and FOB in order to discuss MOB's preferences for support.  MOB made few comments related to her preferences, but reassured FOB that he is doing a good job supporting her.   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  MOB expressed excitement as she discussed becoming an upcoming senior in college.  She stated that she is able to take the majority of classes online, and denied any feelings of being overwhelmed with this obligation.  MOB and FOB  receptive to engaging in routine self-care, and assisted to reduce any negative core beliefs associated with taking care of themselves so that they can be more attentive to the baby.   CSW inquired about substance abuse.  MOB shared that she smoked "occassionally", but stated that once she learned that she was pregnant, she ceased.  She denied any urges, and shared that she will not re-start smoking since she intends to breast feed.  MOB  denied any questions or concerns related to drug screen policy, and acknowledged that CPS report will be made if meconium drug screen is positive.  They denied concerns related to this since MOB shared confidence that the drug screen will be negative.   MOB and FOB receptive to reaching out to CSW if needed prior to discharge.  No barriers to discharge at this time.     VI SOCIAL WORK PLAN Social Work Therapist, art  No Further Intervention Required / No Barriers to Discharge   Type of pt/family education:   Postpartum depression and anxiety  Hospital drug screen policy   If child protective services report - county:   If child protective services report - date:   Information/referral to community resources comment:   Other social work plan:   Ongoing emotional support as needed  Monitoring meconium drug screen, will make CPS report if positive.

## 2013-09-25 ENCOUNTER — Encounter: Payer: Self-pay | Admitting: Pediatrics

## 2013-10-25 ENCOUNTER — Ambulatory Visit: Payer: Medicaid Other | Admitting: Physician Assistant

## 2013-12-02 ENCOUNTER — Encounter (HOSPITAL_COMMUNITY): Payer: Self-pay

## 2013-12-25 ENCOUNTER — Encounter: Payer: Self-pay | Admitting: Obstetrics & Gynecology

## 2014-03-11 IMAGING — US US OB TRANSVAGINAL
1 series · 14 of 28 positions shown · non-contrast
Comparison: Pelvic ultrasound 03/03/2012.

CLINICAL DATA: Irregular cycles. Dating. 6 weeks 5 days by last
menstrual period.

EXAM:
OBSTETRIC <14 WK US AND TRANSVAGINAL OB US
TECHNIQUE: Both transabdominal and transvaginal ultrasound examinations were
performed for complete evaluation of the gestation as well as the
maternal uterus, adnexal regions, and pelvic cul-de-sac.
Transvaginal technique was performed to assess early pregnancy.

[Series 1: us ob comp less 14 wks · 14 of 30 slices shown]
[im 2/30]
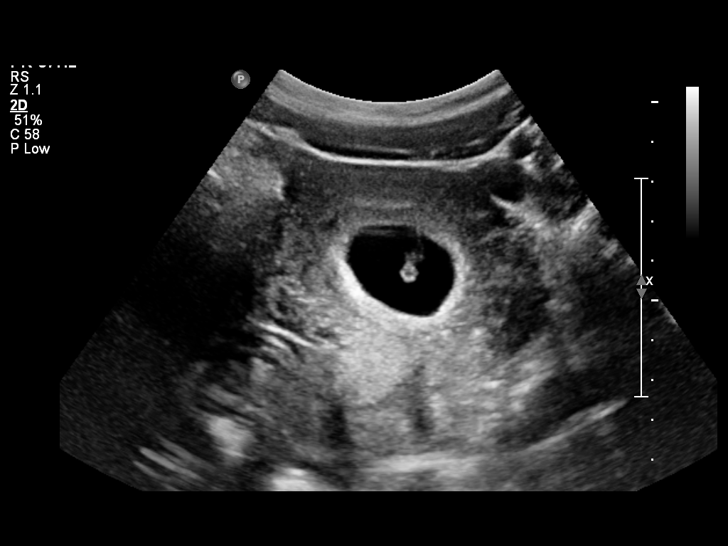
[im 4/30]
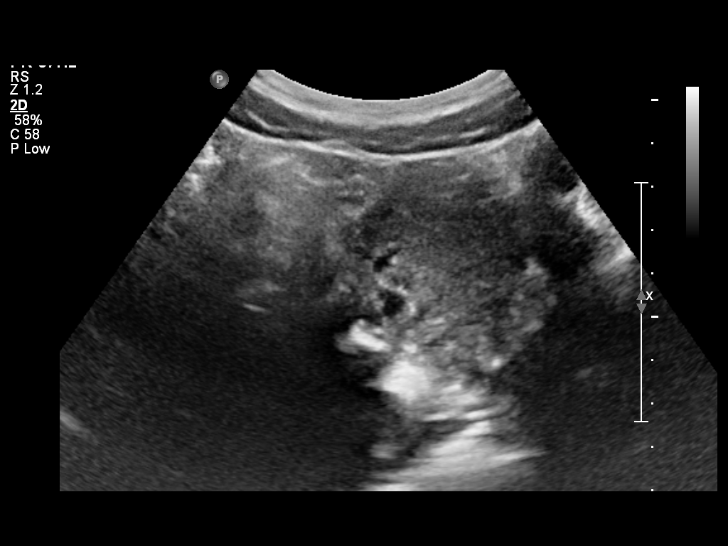
[im 6/30]
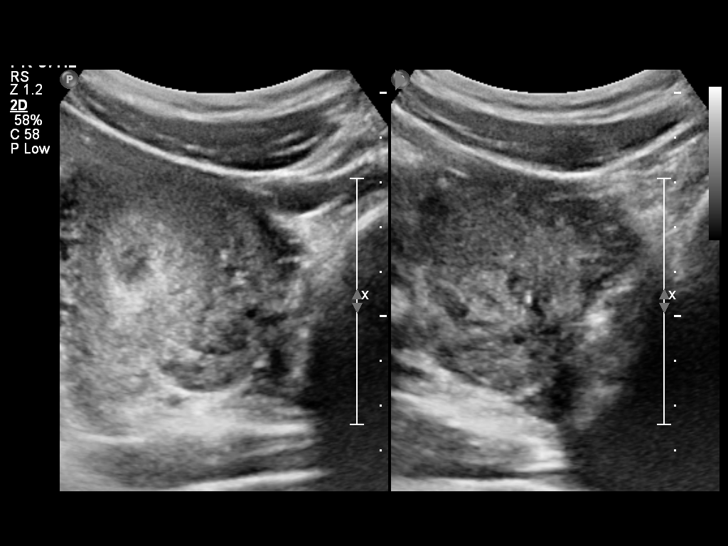
[im 8/30]
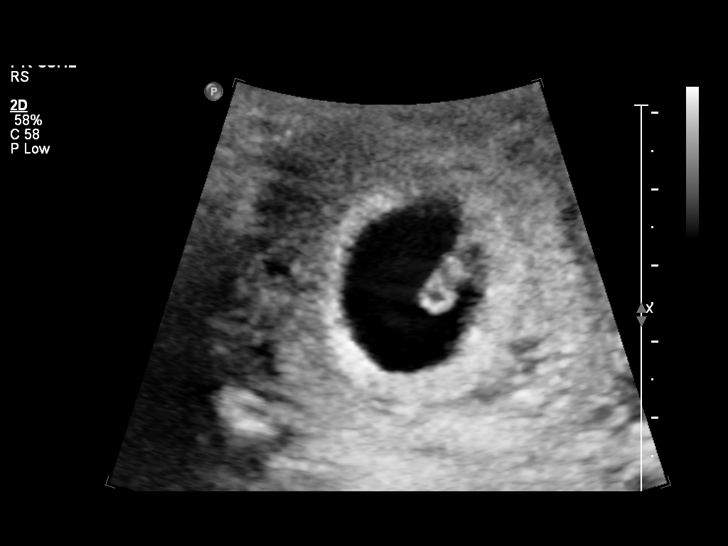
[im 10/30]
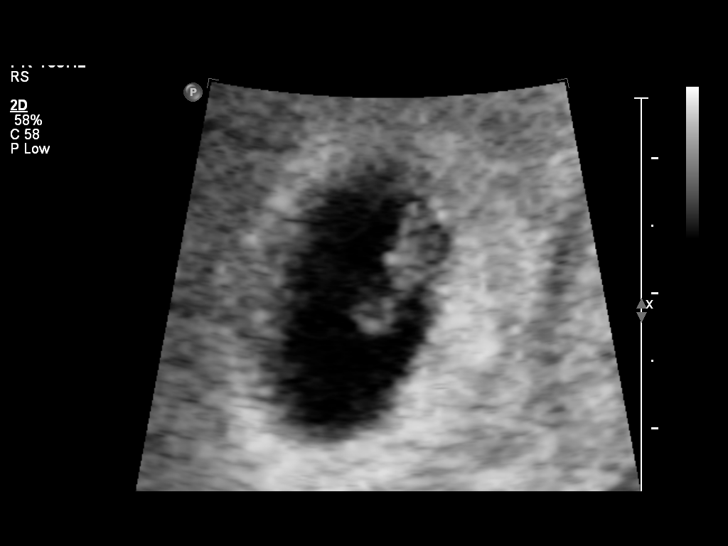
[im 12/30]
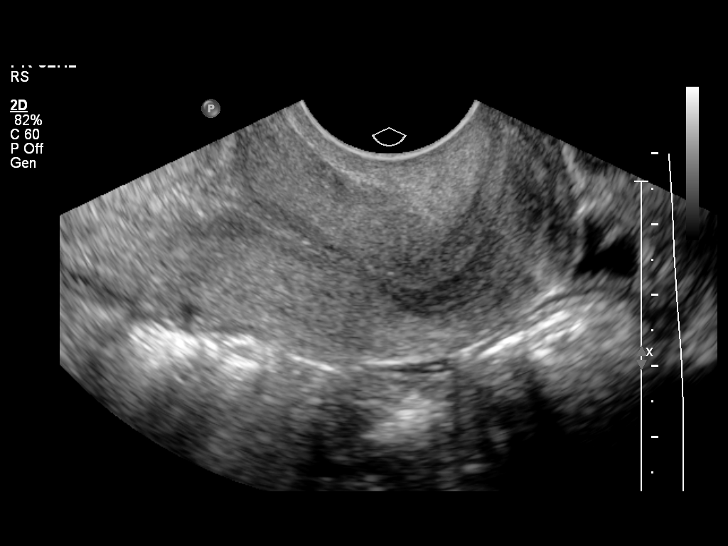
[im 14/30]
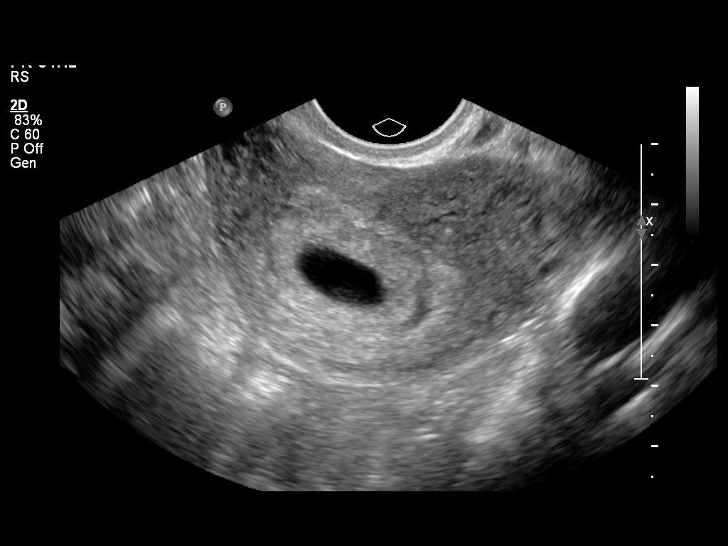
[im 17/30]
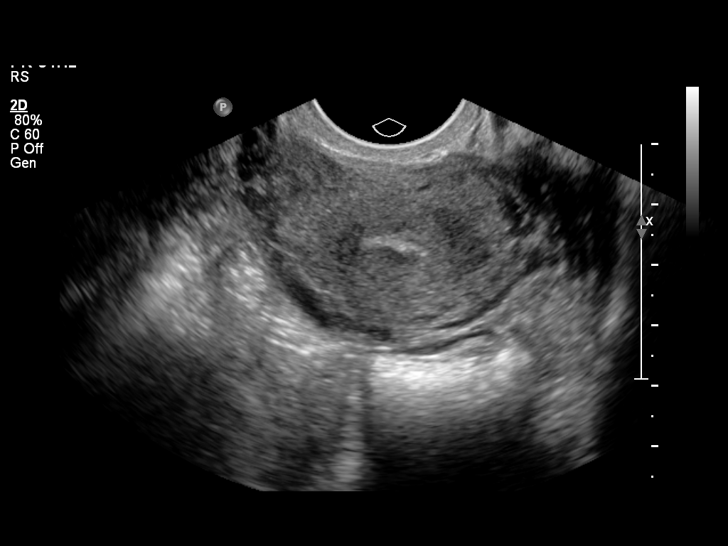
[im 19/30]
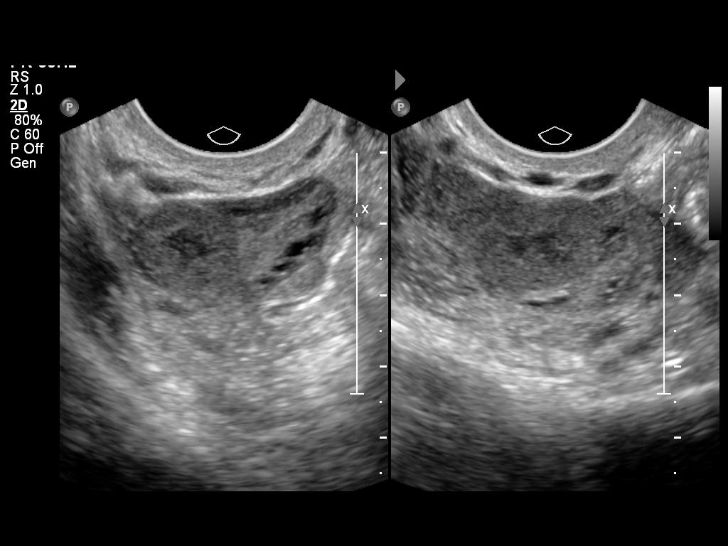
[im 21/30]
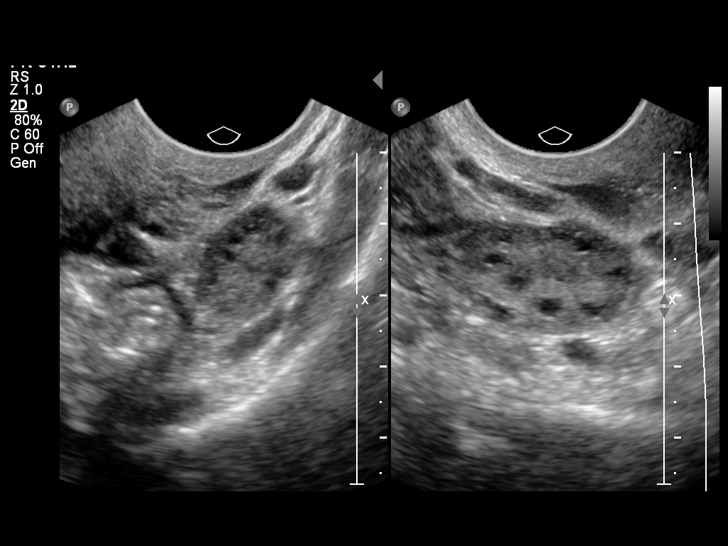
[im 23/30]
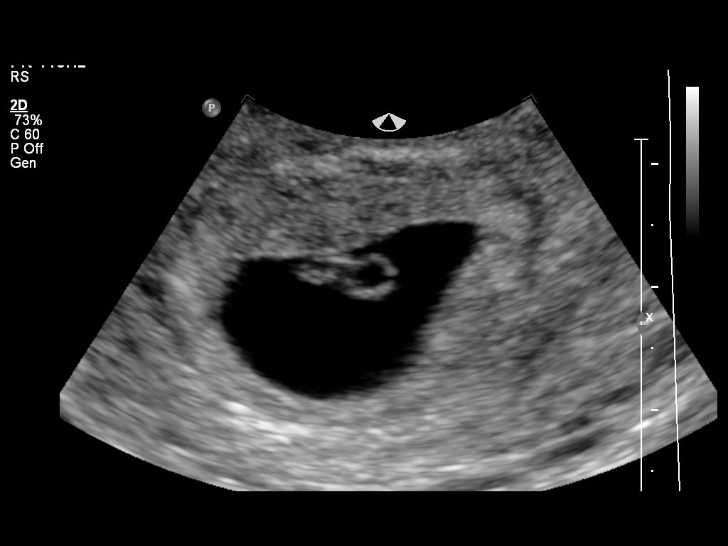
[im 25/30]
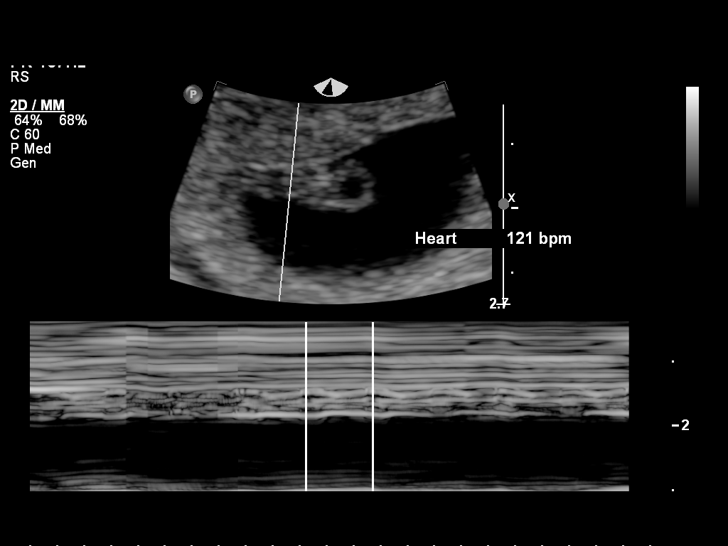
[im 27/30]
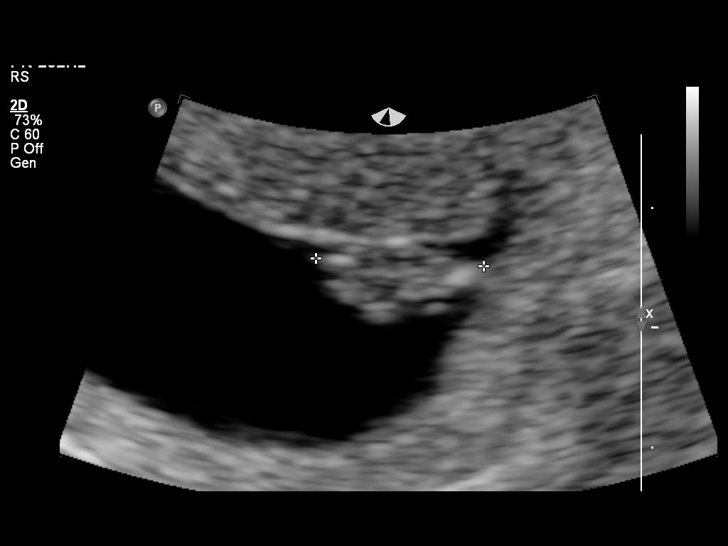
[im 30/30]
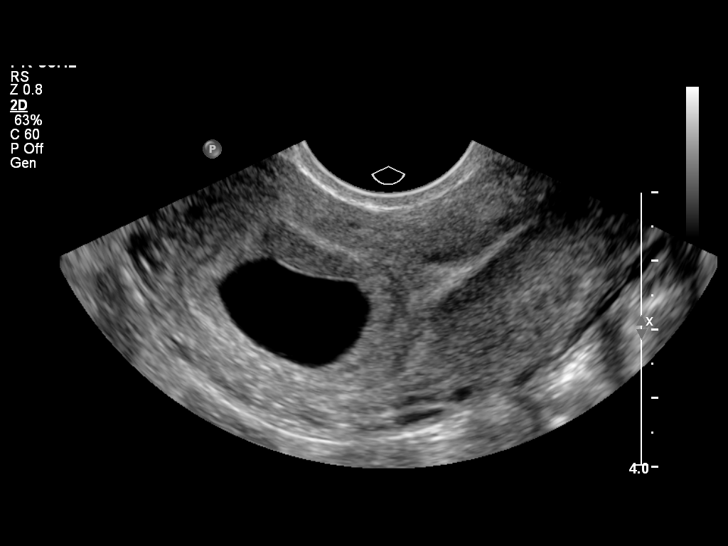

[14 of 28 positions shown; findings below may reference images not displayed]

FINDINGS: Intrauterine gestational sac: Present

Yolk sac:  Present

Embryo:  Present

Cardiac Activity: Present

Heart Rate:  123 bpm

CRL:   7.1  mm   6 w 5d                  US EDC: 09/11/2013

Maternal uterus/adnexae: Right corpus luteal cyst. The ovaries are
otherwise unremarkable in appearance. No subchorionic hematoma. No
free fluid.
IMPRESSION: Single live intrauterine gestation 6 weeks 5 days by LMP.

## 2015-08-11 ENCOUNTER — Ambulatory Visit (INDEPENDENT_AMBULATORY_CARE_PROVIDER_SITE_OTHER): Payer: Medicaid Other | Admitting: Obstetrics and Gynecology

## 2015-08-11 ENCOUNTER — Encounter: Payer: Self-pay | Admitting: Obstetrics and Gynecology

## 2015-08-11 VITALS — BP 117/82 | HR 66 | Temp 97.9°F | Wt 160.0 lb

## 2015-08-11 DIAGNOSIS — R1030 Lower abdominal pain, unspecified: Secondary | ICD-10-CM | POA: Diagnosis not present

## 2015-08-11 LAB — POCT URINE QUALITATIVE DIPSTICK BLOOD: RBC UA: NEGATIVE

## 2015-08-11 NOTE — Progress Notes (Signed)
Obstetrics and Gynecology Visit New Patient Evaluation  Appointment Date: 08/11/2015  OBGYN Clinic: [facility]  Primary Care Provider: [facility]  Referring Provider: Sharion Dove, FNP  Chief Complaint: acute on chronic lower abdominal pain  History of Present Illness: Kelli Jimenez is a 25 y.o. African-American G2P1011 (Patient's last menstrual period was 07/31/2015.), seen for the above chief complaint. Her past medical history is significant for h/o ovarian cysts.  Patient states that she had, for an unknown reason, an u/s at a GYN office in 2012 and they told her she had cysts so she was put on OCPs for this. She had no issues with the OCPs and was on them until she got pregnant and had her daughter in 33. PP, she was put back on OCPs (pt unsure of the name) but stopped after about 8 months b/c of AUB and lower abdominal pain. She hasn't been on anything for [facility] since then. She states that the pain is mid lower abdomen, near the suprapubic area and now has it for about 15d out of the month, it lasts for about 5-10s and then goes away and can come on with movement/exercise; pt hasn't tried any OTC meds, heating pads to see if this helps with the pain.    Now that she's off the pill, she has regular, qmonth periods, with no intermenstrual bleeding and that lasts for about a week and aren't particularly heavy or painful. The cramping she has with her periods is different than the sharp pain that she has which brought her in today. She states that the pain doesn't have any worsening during her cycle but doesn't worsen when she's on her period   No fevers, chills, chest pain, SOB, nausea, vomiting, abdominal pain, dysuria, hematuria, vaginal itching, dyspareunia, diarrhea, constipation, blood in BMs  Review of Systems:Her 12 point review of systems is negative or as noted in the History of Present Illness.  Past Medical History:  Past Medical History  Diagnosis Date  . Ovarian cyst 2013  .  Abscess     Bartholin's Cyst (right)x2 2013  . 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 Vaginal cyst     Past Surgical History:  History reviewed. No pertinent past surgical history.  Past Obstetrical History:  OB History    Gravida Para Term Preterm AB TAB SAB Ectopic Multiple Living   0 1   1      Obstetric Comments   G1: SAB: passed w/o meds or surgery G2: TSVD      Past Gynecological History: As per HPI. Menarche age 55  No. history of abnormal pap smears (last pap smear: 2016, which was negative) Yes.   history of GC/CT/Trich.   She is currently using nothing for contraception.   Social History:  Social History   Social History  . Marital Status: Single    Spouse Name: N/A  . Number of Children: N/A  . Years of Education: N/A   Occupational History  . Not on file.   Social History Main Topics  . Smoking status: Former Smoker    Types: Pipe    Quit date: 01/31/2010  . Smokeless tobacco: Never Used  . Alcohol Use: No     Comment: occassional, light  . Drug Use: No     Comment: pt states quit with pregnancy  . Sexual Activity: Yes    Birth Control/ Protection: Condom   Other Topics Concern  . Not on  file   Social History Narrative    Family History:  Family History  Problem Relation Age of Onset  . Alcohol abuse Neg Hx   . Arthritis Neg Hx   . Asthma Neg Hx   . Birth defects Neg Hx   . Cancer Neg Hx   . COPD Neg Hx   . Depression Neg Hx   . Diabetes Neg Hx   . Drug abuse Neg Hx   . Early death Neg Hx   . Hearing loss Neg Hx   . Heart disease Neg Hx   . Hyperlipidemia Neg Hx   . Hypertension Neg Hx   . Kidney disease Neg Hx   . Learning disabilities Neg Hx   . Mental illness Neg Hx   . Mental retardation Neg Hx   . Miscarriages / Stillbirths Neg Hx   . Stroke Neg Hx   . Vision loss Neg Hx   . Varicose Veins Neg Hx    She denies any female cancers, bleeding or blood clotting disorders.   Medications:  none  Allergies Review of patient's allergies indicates no known allergies.   Physical Exam:  BP 117/82 mmHg  Pulse 66  Temp(Src) 97.9 F (36.6 C) (Oral)  Wt 160 lb (72.576 kg)  LMP 07/31/2015 Body mass index is 25.84 kg/(m^2). General appearance: Well nourished, well developed female in no acute distress.  Neck:  Supple, normal appearance, and no thyromegaly  Cardiovascular: normal s1 and s2.  No murmurs, rubs or gallops. Respiratory:  Clear to auscultation bilateral. Normal respiratory effort Abdomen: positive bowel sounds and no masses, hernias; diffusely non tender to palpation, non distended Back: no CVAT Neuro/Psych:  Normal mood and affect.  Skin:  Warm and dry.  Lymphatic:  No inguinal lymphadenopathy.   Pelvic exam: is not limited by body habitus EGBUS: within normal limits Vagina: within normal limits and with no blood in the vault, Cervix:  no lesions or cervical motion tenderness Uterus:  nonenlarged and approximately 8 week sized Adnexa:  normal adnexa and no mass, fullness, tenderness Rectovaginal: deferred  Laboratory: POC u/a negative  Radiology: none  Assessment: chronic lower pelvic pain  Plan:  F/u GC/CT testing and pelvic u/s ordered for early in next cycle, to decrease the risks of incidental cysts. DDx unclear at this point but endometriosis considered and pt may need to be on hormones (a different kind) to help with cycle to see if this helps  Orders Placed This Encounter  Procedures  . Chlamydia/Gonococcus/Trichomonas, NAA  . US Transvaginal Non-OB  . US Pelvis Complete    RTC 1 month  Cornelia Copaharlie Kelcy Baeten, Jr MD Attending Center for [facility](Faculty Practice)

## 2015-08-13 LAB — CHLAMYDIA/GONOCOCCUS/TRICHOMONAS, NAA
Chlamydia by NAA: NEGATIVE
Gonococcus by NAA: NEGATIVE
Trich vag by NAA: NEGATIVE

## 2015-09-03 ENCOUNTER — Other Ambulatory Visit: Payer: Self-pay

## 2015-09-03 ENCOUNTER — Ambulatory Visit: Payer: Medicaid Other | Admitting: Obstetrics and Gynecology
